# Patient Record
Sex: Female | Born: 1980 | Race: White | Hispanic: No | Marital: Married | Smoking: Former smoker
Health system: Southern US, Community
[De-identification: ages and names within clinical notes are randomized; demographics above are authoritative.]

## PROBLEM LIST (undated history)

## (undated) DIAGNOSIS — Z789 Other specified health status: Secondary | ICD-10-CM

## (undated) HISTORY — PX: WISDOM TOOTH EXTRACTION: SHX21

---
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
## 2010-06-09 NOTE — L&D Delivery Note (Signed)
Delivery Note At 10:04 AM a healthy female was delivered via Vaginal, Spontaneous Delivery (Presentation: Occ. Ant.  ).  Neonatal team present.  APGAR: 8, 9; weight 5 lb 6.2 oz (2444 g).   Placenta status: Intact, Pathology.  Cord: 3 vessels with the following complications: None.  Cord pH: Not done  Anesthesia: Local  Episiotomy: None Lacerations: 1st degree;Periurethral Suture Repair: vicryl Est. Blood Loss (mL): 400  Mom to postpartum.  Baby to nursery-stable.  Shatiqua Heroux,MARIE-LYNE 01/28/2011, 10:41 AM

## 2010-07-19 LAB — RPR: RPR: NONREACTIVE

## 2010-07-19 LAB — ABO/RH

## 2010-07-19 LAB — RUBELLA ANTIBODY, IGM: Rubella: IMMUNE

## 2010-08-08 ENCOUNTER — Inpatient Hospital Stay (HOSPITAL_COMMUNITY): Admission: AD | Admit: 2010-08-08 | Payer: Self-pay | Source: Ambulatory Visit | Admitting: Obstetrics

## 2011-01-27 ENCOUNTER — Encounter (HOSPITAL_COMMUNITY)
Admission: RE | Admit: 2011-01-27 | Discharge: 2011-01-27 | Disposition: A | Discharge status: Home / Self Care | Payer: PRIVATE HEALTH INSURANCE | Source: Ambulatory Visit | Attending: Obstetrics | Admitting: Obstetrics

## 2011-01-27 DIAGNOSIS — O923 Agalactia: Secondary | ICD-10-CM | POA: Insufficient documentation

## 2011-01-28 ENCOUNTER — Other Ambulatory Visit: Payer: Self-pay | Admitting: Obstetrics & Gynecology

## 2011-01-28 ENCOUNTER — Inpatient Hospital Stay (HOSPITAL_COMMUNITY)
Admission: AD | Admit: 2011-01-28 | Discharge: 2011-01-30 | DRG: 775 | Disposition: A | Discharge status: Home / Self Care | Payer: PRIVATE HEALTH INSURANCE | Source: Ambulatory Visit | Attending: Obstetrics & Gynecology | Admitting: Obstetrics & Gynecology

## 2011-01-28 ENCOUNTER — Encounter (HOSPITAL_COMMUNITY): Payer: Self-pay | Admitting: *Deleted

## 2011-01-28 DIAGNOSIS — O4290 Premature rupture of membranes, unspecified as to length of time between rupture and onset of labor, unspecified weeks of gestation: Secondary | ICD-10-CM | POA: Diagnosis present

## 2011-01-28 HISTORY — DX: Other specified health status: Z78.9

## 2011-01-28 LAB — CBC
MCV: 95.2 fL (ref 78.0–100.0)
Platelets: 256 10*3/uL (ref 150–400)
RDW: 12.9 % (ref 11.5–15.5)
WBC: 14.8 10*3/uL — ABNORMAL HIGH (ref 4.0–10.5)

## 2011-01-28 LAB — ABO/RH: ABO/RH(D): O POS

## 2011-01-28 MED ORDER — SIMETHICONE 80 MG PO CHEW: 80.0000 mg | CHEWABLE_TABLET | ORAL | Status: DC | PRN

## 2011-01-28 MED ORDER — NALBUPHINE SYRINGE 5 MG/0.5 ML: 5.0000 mg | INJECTION | Freq: Once | INTRAMUSCULAR | Status: DC

## 2011-01-28 MED ORDER — SENNOSIDES-DOCUSATE SODIUM 8.6-50 MG PO TABS
2.0000 | ORAL_TABLET | Freq: Every day | ORAL | Status: DC
  Administered 2011-01-28 – 2011-01-29 (×2): 2 via ORAL

## 2011-01-28 MED ORDER — DIPHENHYDRAMINE HCL 25 MG PO CAPS: 25.0000 mg | ORAL_CAPSULE | Freq: Four times a day (QID) | ORAL | Status: DC | PRN

## 2011-01-28 MED ORDER — ONDANSETRON HCL 4 MG/2ML IJ SOLN: 4.0000 mg | INTRAMUSCULAR | Status: DC | PRN

## 2011-01-28 MED ORDER — ACETAMINOPHEN 325 MG PO TABS: 650.0000 mg | ORAL_TABLET | ORAL | Status: DC | PRN

## 2011-01-28 MED ORDER — TETANUS-DIPHTH-ACELL PERTUSSIS 5-2.5-18.5 LF-MCG/0.5 IM SUSP
0.5000 mL | Freq: Once | INTRAMUSCULAR | Status: AC
  Administered 2011-01-29: 0.5 mL via INTRAMUSCULAR
  Filled 2011-01-28: qty 0.5

## 2011-01-28 MED ORDER — FLEET ENEMA 7-19 GM/118ML RE ENEM: 1.0000 | ENEMA | RECTAL | Status: DC | PRN

## 2011-01-28 MED ORDER — OXYCODONE-ACETAMINOPHEN 5-325 MG PO TABS: 2.0000 | ORAL_TABLET | ORAL | Status: DC | PRN

## 2011-01-28 MED ORDER — LACTATED RINGERS IV SOLN
INTRAVENOUS | Status: DC
  Administered 2011-01-28: 06:00:00 via INTRAVENOUS

## 2011-01-28 MED ORDER — DIBUCAINE 1 % RE OINT: 1.0000 "application " | TOPICAL_OINTMENT | RECTAL | Status: DC | PRN

## 2011-01-28 MED ORDER — OXYTOCIN BOLUS FROM INFUSION
500.0000 mL | Freq: Once | INTRAVENOUS | Status: DC
  Filled 2011-01-28: qty 500

## 2011-01-28 MED ORDER — LIDOCAINE HCL (PF) 1 % IJ SOLN
INTRAMUSCULAR | Status: AC
  Administered 2011-01-28: 30 mL via SUBCUTANEOUS
  Filled 2011-01-28: qty 30

## 2011-01-28 MED ORDER — PENICILLIN G POTASSIUM 5000000 UNITS IJ SOLR
2.5000 10*6.[IU] | INTRAVENOUS | Status: DC
  Filled 2011-01-28 (×5): qty 2.5

## 2011-01-28 MED ORDER — ZOLPIDEM TARTRATE 5 MG PO TABS: 5.0000 mg | ORAL_TABLET | Freq: Every evening | ORAL | Status: DC | PRN

## 2011-01-28 MED ORDER — LACTATED RINGERS IV SOLN: INTRAVENOUS | Status: DC

## 2011-01-28 MED ORDER — WITCH HAZEL-GLYCERIN EX PADS: 1.0000 "application " | MEDICATED_PAD | CUTANEOUS | Status: DC | PRN

## 2011-01-28 MED ORDER — ONDANSETRON HCL 4 MG/2ML IJ SOLN: 4.0000 mg | Freq: Four times a day (QID) | INTRAMUSCULAR | Status: DC | PRN

## 2011-01-28 MED ORDER — PENICILLIN G POTASSIUM 5000000 UNITS IJ SOLR
2.5000 10*6.[IU] | INTRAMUSCULAR | Status: DC
  Filled 2011-01-28 (×2): qty 5

## 2011-01-28 MED ORDER — IBUPROFEN 600 MG PO TABS
600.0000 mg | ORAL_TABLET | Freq: Four times a day (QID) | ORAL | Status: DC
  Administered 2011-01-28 – 2011-01-30 (×8): 600 mg via ORAL
  Filled 2011-01-28 (×6): qty 1

## 2011-01-28 MED ORDER — ONDANSETRON HCL 4 MG PO TABS: 4.0000 mg | ORAL_TABLET | ORAL | Status: DC | PRN

## 2011-01-28 MED ORDER — BENZOCAINE-MENTHOL 20-0.5 % EX AERO
INHALATION_SPRAY | CUTANEOUS | Status: AC
  Administered 2011-01-28: 1 via TOPICAL
  Filled 2011-01-28: qty 56

## 2011-01-28 MED ORDER — LIDOCAINE HCL (PF) 1 % IJ SOLN
30.0000 mL | INTRAMUSCULAR | Status: AC | PRN
  Administered 2011-01-28: 30 mL via SUBCUTANEOUS

## 2011-01-28 MED ORDER — IBUPROFEN 600 MG PO TABS
600.0000 mg | ORAL_TABLET | Freq: Four times a day (QID) | ORAL | Status: DC | PRN
  Filled 2011-01-28: qty 1

## 2011-01-28 MED ORDER — LACTATED RINGERS IV SOLN: 500.0000 mL | INTRAVENOUS | Status: DC | PRN

## 2011-01-28 MED ORDER — LANOLIN HYDROUS EX OINT: TOPICAL_OINTMENT | CUTANEOUS | Status: DC | PRN

## 2011-01-28 MED ORDER — BENZOCAINE-MENTHOL 20-0.5 % EX AERO
1.0000 "application " | INHALATION_SPRAY | CUTANEOUS | Status: DC | PRN
  Administered 2011-01-28: 1 via TOPICAL

## 2011-01-28 MED ORDER — OXYCODONE-ACETAMINOPHEN 5-325 MG PO TABS: 1.0000 | ORAL_TABLET | ORAL | Status: DC | PRN

## 2011-01-28 MED ORDER — DEXTROSE 5 % IV SOLN
5.0000 10*6.[IU] | Freq: Once | INTRAVENOUS | Status: AC
  Administered 2011-01-28: 5 10*6.[IU] via INTRAVENOUS
  Filled 2011-01-28: qty 5

## 2011-01-28 MED ORDER — TERBUTALINE SULFATE 1 MG/ML IJ SOLN: 0.2500 mg | Freq: Once | INTRAMUSCULAR | Status: DC | PRN

## 2011-01-28 MED ORDER — OXYTOCIN 10 UNIT/ML IJ SOLN
INTRAMUSCULAR | Status: AC
  Administered 2011-01-28: 10 [IU] via INTRAMUSCULAR
  Filled 2011-01-28: qty 2

## 2011-01-28 MED ORDER — OXYTOCIN 20 UNITS IN LACTATED RINGERS INFUSION - SIMPLE: 125.0000 mL/h | INTRAVENOUS | Status: DC

## 2011-01-28 MED ORDER — PRENATAL PLUS 27-1 MG PO TABS
1.0000 | ORAL_TABLET | Freq: Every day | ORAL | Status: DC
  Administered 2011-01-28 – 2011-01-30 (×3): 1 via ORAL
  Filled 2011-01-28 (×3): qty 1

## 2011-01-28 MED ORDER — CITRIC ACID-SODIUM CITRATE 334-500 MG/5ML PO SOLN: 30.0000 mL | ORAL | Status: DC | PRN

## 2011-01-28 MED ORDER — PENICILLIN G POTASSIUM 5000000 UNITS IJ SOLR
5.0000 10*6.[IU] | Freq: Once | INTRAMUSCULAR | Status: DC
  Filled 2011-01-28: qty 5

## 2011-01-28 MED ORDER — OXYTOCIN 20 UNITS IN LACTATED RINGERS INFUSION - SIMPLE
1.0000 m[IU]/min | INTRAVENOUS | Status: DC
  Administered 2011-01-28: 3 m[IU]/min via INTRAVENOUS
  Administered 2011-01-28: 2 m[IU]/min via INTRAVENOUS
  Administered 2011-01-28: 1 m[IU]/min via INTRAVENOUS
  Filled 2011-01-28: qty 1000

## 2011-01-28 NOTE — Progress Notes (Signed)
POST FERN SLIDE. PT CHANGING CLOTHES

## 2011-01-28 NOTE — Progress Notes (Signed)
WAS IN OFFICE 01-16-2011-  NO VE-

## 2011-01-28 NOTE — Progress Notes (Signed)
UR chart review completed.  

## 2011-01-28 NOTE — H&P (Signed)
Kelsey Flores is a 30 y.o. female G1P0000 [redacted]w[redacted]d presenting for PROM.  HPI/HPP:  Normal pregnancy up to now.  PROM clear fluid with irregular UCs.  No vag. Bleeding.  FMs pos.  No PIH Sx.  OB History    Grav Para Term Preterm Abortions TAB SAB Ect Mult Living   1 0 0 0 0 0 0 0 0 0      Past Medical History  Diagnosis Date  . No pertinent past medical history    Past Surgical History  Procedure Date  . Wisdom tooth extraction    Family History: family history is not on file. Social History:  reports that she has quit smoking. She has never used smokeless tobacco. She reports that she does not drink alcohol or use illicit drugs. Current facility-administered medications:oxytocin (PITOCIN) 10 UNIT/ML injection, , , , , 10 Units at 01/28/11 1010;  acetaminophen (TYLENOL) tablet 650 mg, 650 mg, Oral, Q4H PRN, Marie-[place]ne Narcisa Ganesh;  citric acid-sodium citrate (ORACIT) solution 30 mL, 30 mL, Oral, Q2H PRN, Marie-Lyne Kem Hensen;  ibuprofen (ADVIL,MOTRIN) tablet 600 mg, 600 mg, Oral, Q6H PRN, Marie-Lyne Ballard Budney lactated ringers infusion 500-1,000 mL, 500-1,000 mL, Intravenous, PRN, Marie-Lyne Letzy Gullickson;  lactated ringers infusion, , Intravenous, Continuous, Marie-[place]ne Annel Zunker;  lactated ringers infusion, , Intravenous, Continuous, [place]ne Adisyn Ruscitti;  lidocaine (XYLOCAINE) 1 % injection 30 mL, 30 mL, Subcutaneous, PRN, Marie-Lyne Kyrell Ruacho, 30 mL at 01/28/11 1013;  nalbuphine (NUBAIN) injection 5 mg, 5 mg, Subcutaneous, Once, Marie-[place] Vania Rosero nalbuphine (NUBAIN) injection 5 mg, 5 mg, Intravenous, Once, Marie-[place]ne Irene Collings;  ondansetron (ZOFRAN) injection 4 mg, 4 mg, Intravenous, Q6H PRN, Marie-Lyne Maddox Bratcher;  oxyCODONE-acetaminophen (PERCOCET) 5-325 MG per tablet 2 tablet, 2 tablet, Oral, Q3H PRN, Marie-Lyne Loni Delbridge;  oxytocin (PITOCIN) IV BOLUS FROM BAG, 500 mL, Intravenous, Once, Marie-Lyne Ruddy Swire oxytocin (PITOCIN) IV infusion 20 units in LR 1000 mL, 1-40 milli-units/min, Intravenous, Titrated, Marie-Lyne Jamorian Dimaria, 4  milli-units/min at 01/28/11 0743;  oxytocin (PITOCIN) IV infusion 20 units in LR 1000 mL, 125 mL/hr, Intravenous, Continuous, Marie-Lyne Kerigan Narvaez;  penicillin G potassium 2.5 Million Units in dextrose 5 % 100 mL IVPB, 2.5 Million Units, Intravenous, Q4H, Marie-Lyne Camey Edell penicillin G potassium 5 Million Units in dextrose 5 % 250 mL IVPB, 5 Million Units, Intravenous, Once, [employer], 5 Million Units at 01/28/11 0557;  sodium phosphate (FLEET) 7-19 GM/118ML enema 1 enema, 1 enema, Rectal, PRN, Marie-Lyne Aadvik Roker;  terbutaline (BRETHINE) injection 0.25 mg, 0.25 mg, Subcutaneous, Once PRN, Genia Del;  DISCONTD: nalbuphine (NUBAIN) injection 5 mg, 5 mg, Intravenous, Once, Marie-Lyne Aireonna Bauer DISCONTD: penicillin G potassium injection 2.5 Million Units, 2.5 Million Units, Intravenous, Q4H, Marie-Lyne Makyiah Lie;  DISCONTD: penicillin G potassium injection 5 Million Units, 5 Million Units, Intravenous, Once, Marie-Lyne Carlethia Mesquita No Known Allergies  Dilation: 10 Effacement (%): 100 Station: +1 Exam by:: m wilkins rnc Blood pressure 116/68, pulse 98, temperature 98.1 F (36.7 C), temperature source Oral, resp. rate 18, height 5\' 6"  (1.676 m), weight 78.019 kg (172 lb).  No pain meds.   FHR 140's, acc. Present.  Mild variables. Pen G covered.  HPP: There is no problem list on file for this patient.   Prenatal labs: ABO, Rh: O/Positive/-- (02/10 0000) Antibody: Negative (02/02 0000) Rubella:  Immune RPR: Nonreactive (02/10 0000)  HBsAg: Negative (02/10 0000)  HIV: Non-reactive (02/10 0000)  Genetic testing: Quad neg [place] anato: Normal 1 hr GTT: wnl GBS:  Not done yet.    Assessment/Plan: 35+ wks PROM.  Labor with Pito.  Pen G received.  FHR reassuring.  Ready for delivery.  Jermya Dowding,MARIE-LYNE 01/28/2011, 10:36 AM

## 2011-01-28 NOTE — Progress Notes (Signed)
DENIES H/A , BLURRED VISION - OR EPIGASTRIC PAIN 

## 2011-01-29 LAB — CBC
MCHC: 33.7 g/dL (ref 30.0–36.0)
Platelets: 247 10*3/uL (ref 150–400)
RDW: 13.4 % (ref 11.5–15.5)
WBC: 17.5 10*3/uL — ABNORMAL HIGH (ref 4.0–10.5)

## 2011-01-29 NOTE — Progress Notes (Signed)
Post Partum Day #1            Subjective:   Pt reports feeling well/ Tolerating po/ Voiding without problems/ No n/v/ Bleeding is light, reports one clot last night, none since/ Pain controlled withprescription NSAID's including ibuprofen (Motrin) Working on latch, infant on supplemental formula, 35 wks preemie  Newborn info:  Information for the patient's newborn:  ALETTE, KATAOKA Brittane [[id number]]  female  / circumcision - desires to delay circ until tomorrow, per peds to establish breastfeeding well     Objective:     VS: Blood pressure 128/83, pulse 66, temperature 97.9 F (36.6 C), temperature source Oral, resp. rate 18, height 5\' 6"  (1.676 m), weight 172 lb (78.019 kg)  Intake/Output Summary (Last 24 hours) at 01/29/11 1027 Last data filed at 01/28/11 1059  Gross per 24 hour  Intake      0 ml  Output    400 ml  Net   -400 ml        Basename 01/29/11 0550 01/28/11 0554  WBC 17.5* 14.8*  HGB 10.9* 11.8*  HCT 32.3* 33.9*  PLT 247 256     Blood type: --/--/O POS (08/21 0630)  Rubella: Immune (02/10 0000)     Physical Exam:   A & O x 3 NAD   Lungs: CTAB  Heart: RR  Abdomen: soft, non-tender, FF @ U-1  Perineum: repair intact, edema none  Lochia: small  Extremities: neg Homans', edema neg    Assessment/Plan:  PPD # 1 / 30 y.o., G1P0101 S/P:spontaneous vaginal    normal postpartum exam  Doing well  Continue routine post partum orders  Anticipate D/C in Am         LOS: 1 day   Jsaon Yoo, CNM, MSN 01/29/2011, 10:27 AM

## 2011-01-30 MED ORDER — IBUPROFEN 600 MG PO TABS: 600.0000 mg | ORAL_TABLET | Freq: Four times a day (QID) | ORAL | Status: AC

## 2011-01-30 NOTE — Progress Notes (Signed)
  PPD 2 SVD  S:  Reports feeling well             Tolerating po/ No nausea or vomiting             Bleeding is light             Pain controlled withprescription NSAID's including motrin only             Up ad lib / ambulatory  Newborn breast feeding  / Circumcision yes today prior to discharge   O:  A & O x 3 NAD             VS: Blood pressure 104/70, pulse 76, temperature 98.1 F (36.7 C), temperature source Oral, resp. rate 18, height 5\' 6"  (1.676 m), weight 78.019 kg (172 lb), unknown if currently breastfeeding.  LABS:  Basename 01/29/11 0550 01/28/11 0554  HGB 10.9* 11.8*  HCT 32.3* 33.9*    I&O:        Lungs: Clear and unlabored  Heart: regular rate and rhythm / no mumurs  Abdomen: soft, non-tender, non-distended              Fundus: firm, non-tender, U-2  Perineum: slight edema  Lochia: light  Extremities: no edema, no calf pain or tenderness, negative Homans    A/P: PPD # 2 Preterm SVD Female (35 weeks)   Doing well - stable status  Routine post partum orders  Discharge home  [facility] 01/30/2011, 10:15 AM

## 2011-01-30 NOTE — Discharge Summary (Signed)
Obstetric Discharge Summary Reason for Admission: onset of labor /preterm labor Prenatal Procedures: none Intrapartum Procedures: spontaneous vaginal delivery  Postpartum Procedures: none Complications-Operative and Postpartum: none Hemoglobin  Date Value Range Status  01/29/2011 10.9* 12.0-15.0 (g/dL) Final     HCT  Date Value Range Status  01/29/2011 32.3* 36.0-46.0 (%) Final    Discharge Diagnoses: Premature labor and SVD  Discharge Information: Date: 01/30/2011 Activity: pelvic rest Diet: routine Medications: Ibuprophen Condition: stable Instructions: refer to practice specific booklet Discharge to: home Follow-up Information    Follow up with [facility].. Make an appointment in 6 weeks.   Contact information:   [address] [place] [address] [phone]          Newborn Data: Live born female  Birth Weight: 5 lb 6.2 oz (2444 g) APGAR: 8, 9  Home with mother.  Kelsey Flores 01/30/2011, 10:16 AM

## 2011-01-31 ENCOUNTER — Ambulatory Visit (HOSPITAL_COMMUNITY)
Admission: RE | Admit: 2011-01-31 | Discharge: 2011-01-31 | Disposition: A | Discharge status: Home / Self Care | Payer: PRIVATE HEALTH INSURANCE | Source: Ambulatory Visit | Attending: Obstetrics | Admitting: Obstetrics

## 2011-01-31 NOTE — Progress Notes (Signed)
Infant Lactation Consultation Outpatient Visit Note  Patient Name: Kelsey Flores Date of Birth: April 05, 1981 Birth Weight:  5 lbs. 7 oz. Gestational Age at Delivery: Gestational Age: <None> [redacted] weeks gestation Type of Delivery: Vaginal Delivery @ 35 weeks  Breastfeeding History Frequency of Breastfeeding: 2-3 hrs.  Length of Feeding: 5-20 mins Voids: QS Stools: QS  Supplementing / Method: Pumping:  Type of Pump: manual   Frequency: none today, but usually every time baby feeds  Volume: 0-5ml    Comments: It was encouraged by inpatient LC to rent a DEBP, but patient declined as she was checking with insurance coverage and felt that a manual pump would be sufficient. Mom comes in today at day 3, breast full, firm, tender, and bilateral redness over both entire breasts.  Pre-pumped before helping with breastfeeding to soften, and make latching easier for Kelsey Flores.   Consultation Evaluation:  Initial Feeding Assessment: Pre-feed Weight:5-2.1 Post-feed Weight:5-2.4 Amount Transferred:10 ml Comments:right breast 25 mins.  Additional Feeding Assessment: Pre-feed Weight: 5-2.4 Post-feed Weight: 5-3.2 Amount Transferred: 25ml Comments:17ml from SNS (EBM) and 15 ml from breast  Additional Feeding Assessment: Pre-feed Weight: Post-feed Weight: Amount Transferred: Comments:  Total Breast milk Transferred this Visit: 25ml Total Supplement Given: 30ml (10 from SNS, 20 ml from bottle)  Additional Interventions:      Follow-Up Instructions given: Mom to feed Kelsey Flores every 2-3 hrs. To use SNS on 2nd breast with 30ml EBM If any milk left in SNS, offer bottle.  Mom to pump both breasts 15-20 mins.   Encouraged Mom to put ice packs on both breasts for 20 mins and pump 20 mins, repeating as needed to soften breasts.  Rented DEBP with instructions.  Made appt. For 02/05/11 @ 2:30p for f/u feeding assessment.      Kelsey Flores 01/31/2011, 6:27 PM

## 2011-02-05 ENCOUNTER — Encounter (HOSPITAL_COMMUNITY): Payer: Self-pay

## 2011-02-07 ENCOUNTER — Encounter (HOSPITAL_COMMUNITY): Payer: Self-pay | Admitting: Lactation Services

## 2011-02-07 ENCOUNTER — Ambulatory Visit (HOSPITAL_COMMUNITY)
Admission: RE | Admit: 2011-02-07 | Discharge: 2011-02-07 | Disposition: A | Discharge status: Home / Self Care | Payer: PRIVATE HEALTH INSURANCE | Source: Ambulatory Visit | Attending: Obstetrics | Admitting: Obstetrics

## 2011-02-07 NOTE — Progress Notes (Signed)
Infant Lactation Consultation Outpatient Visit Note  Patient Name: Kelsey Flores mother Infant Kelsey Flores Date of Birth: Dec 30, 1980 Birth Weight:  5 lbs. 6 oz. Gestational Age at Delivery: Gestational Age: <None>35 weeks Type of Delivery: SVD  Breastfeeding History Frequency of Breastfeeding: 2-3 hrs Length of Feeding: 10-20 mins Voids: 7 Stools: 7  Supplementing / Method: Pumping:  Type of Pump: symphony rented   Frequency: 3 hrs.  Volume:  60-55ml  Comments:    Consultation Evaluation:   Initial Feeding Assessment: Pre-feed Weight:5lbs 10.4 oz (a weight gain of 8 oz in 7 days) Post-feed Weight: Amount Transferred: 50ml Comments:  Infant had been fed 2 hours prior to outpatient appt.  Mom tried to latch and breast feed without nipple shield, but baby became non-nutritive after 10 mins, and tranferred minimal amt. (2 ml).  Initiated 24mm nipple shield and baby was vigorous with frequent, regular swallowing observed.  Baby transferred a total of 50 ml from both breasts.    Additional Feeding Assessment: Pre-feed Weight: Post-feed Weight: Amount Transferred: Comments:  Additional Feeding Assessment: Pre-feed Weight: Post-feed Weight: Amount Transferred: Comments:  Total Breast milk Transferred this Visit: 50 Total Supplement Given: 0  Additional Interventions:  Mom to continue to feed Kelsey Flores on demand with pumping pc 2-3 times a day.  To continue to pump 15-20 minutes whenever baby receives bottle feeding rather than breastfeeding.  Mom doing very well positioning and latching baby. 2 week pediatrician appointment in 4 days.   Follow-Up  Friday, Sept. 7th at 1pm for feeding assessment and weight    Kelsey Flores 02/07/2011, 12:09 PM

## 2011-02-07 NOTE — Progress Notes (Signed)
Infant Lactation Consultation Outpatient Visit Note  Patient Name: Kelsey Flores Date of Birth: 1980/12/03 Birth Weight: 5 lbs. 6 oz.  Gestational Age at Delivery: Gestational Age: <None> Type of Delivery: Vagnal  Breastfeeding History Frequency of Breastfeeding: 3 hrs Length of Feeding: 10-60m Voids: 7 Stools: 7  Supplementing / Method: Pumping:  Type of Pump: Rented Symphony   Frequency: every 3 hrs.  Volume:  40-80 ml.  Comments:    Consultation Evaluation:  Initial Feeding Assessment: Pre-feed [id number] Post-feed [id number]:1478 Amount Transferred:2 Comments: without using nipple shield, 10 minutes before falling asleep  Additional Feeding Assessment: Pre-feed [id number]:3086 Post-feed Weight:2600 Amount Transferred:36 Comments: With 24 mm nipple shield, baby much more nutritive   Additional Feeding Assessment: Pre-feed Weight: Post-feed Weight: Amount Transferred: Comments:  Total Breast milk Transferred this Visit:  Total Supplement Given:   Additional Interventions:   Follow-Up      Kelsey Flores 02/07/2011, 11:12 AM

## 2011-03-03 ENCOUNTER — Encounter (HOSPITAL_COMMUNITY)
Admission: RE | Admit: 2011-03-03 | Discharge: 2011-03-03 | Disposition: A | Discharge status: Home / Self Care | Payer: PRIVATE HEALTH INSURANCE | Source: Ambulatory Visit | Attending: Obstetrics | Admitting: Obstetrics

## 2011-03-03 DIAGNOSIS — O923 Agalactia: Secondary | ICD-10-CM | POA: Insufficient documentation

## 2014-02-10 ENCOUNTER — Ambulatory Visit (INDEPENDENT_AMBULATORY_CARE_PROVIDER_SITE_OTHER): Payer: Commercial Managed Care - PPO

## 2014-02-10 ENCOUNTER — Other Ambulatory Visit: Payer: Self-pay | Admitting: Sports Medicine

## 2014-02-10 ENCOUNTER — Ambulatory Visit (INDEPENDENT_AMBULATORY_CARE_PROVIDER_SITE_OTHER): Payer: Commercial Managed Care - PPO | Admitting: Sports Medicine

## 2014-02-10 ENCOUNTER — Encounter: Payer: Self-pay | Admitting: Sports Medicine

## 2014-02-10 VITALS — BP 140/82 | HR 80 | Ht 65.0 in | Wt 178.0 lb

## 2014-02-10 DIAGNOSIS — M25569 Pain in unspecified knee: Secondary | ICD-10-CM

## 2014-02-10 DIAGNOSIS — M25561 Pain in right knee: Secondary | ICD-10-CM

## 2014-02-10 MED ORDER — DICLOFENAC SODIUM 75 MG PO TBEC: 75.0000 mg | DELAYED_RELEASE_TABLET | Freq: Two times a day (BID) | ORAL | Status: DC

## 2014-02-10 NOTE — Progress Notes (Deleted)
Patient ID: Kelsey Flores, female   DOB: 08-15-1980, 33 y.o.   MRN: [id number]   Subjective:    I'm seeing this patient as a consultation for:  CC:   HPI:  Past medical history, Surgical history, Family history not pertinant except as noted below, Social history, Allergies, and medications have been entered into the medical record, reviewed, and no changes needed.   Review of Systems: No headache, visual changes, nausea, vomiting, diarrhea, constipation, dizziness, abdominal pain, skin rash, fevers, chills, night sweats, weight loss, swollen lymph nodes, body aches, joint swelling, muscle aches, chest pain, shortness of breath, mood changes, visual or auditory hallucinations.   Objective:   General: Well Developed, well nourished, and in no acute distress.  Neuro/Psych: Alert and oriented x3, extra-ocular muscles intact, able to move all 4 extremities, sensation grossly intact. Skin: Warm and dry, no rashes noted.  Respiratory: Not using accessory muscles, speaking in full sentences, trachea midline.  Cardiovascular: Pulses palpable, no extremity edema. Abdomen: Does not appear distended.   Impression and Recommendations:   This case required medical decision making of moderate complexity.
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
## 2014-02-10 NOTE — Assessment & Plan Note (Signed)
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 Diclofenac, formal PT, x-rays. Return in 4-6 weeks, injection if no better

## 2014-02-20 ENCOUNTER — Encounter: Payer: Self-pay | Admitting: Sports Medicine

## 2014-03-24 ENCOUNTER — Ambulatory Visit: Payer: Self-pay | Admitting: Sports Medicine

## 2014-04-10 ENCOUNTER — Encounter: Payer: Self-pay | Admitting: Sports Medicine

## 2014-09-05 IMAGING — CR DG KNEE 1-2V*L*
4 series · 4 of 4 positions shown · non-contrast
Comparison: None.

CLINICAL DATA: Right knee pain

EXAM:
LEFT KNEE - 1-2 VIEW

[view not recorded (1 of 4)]
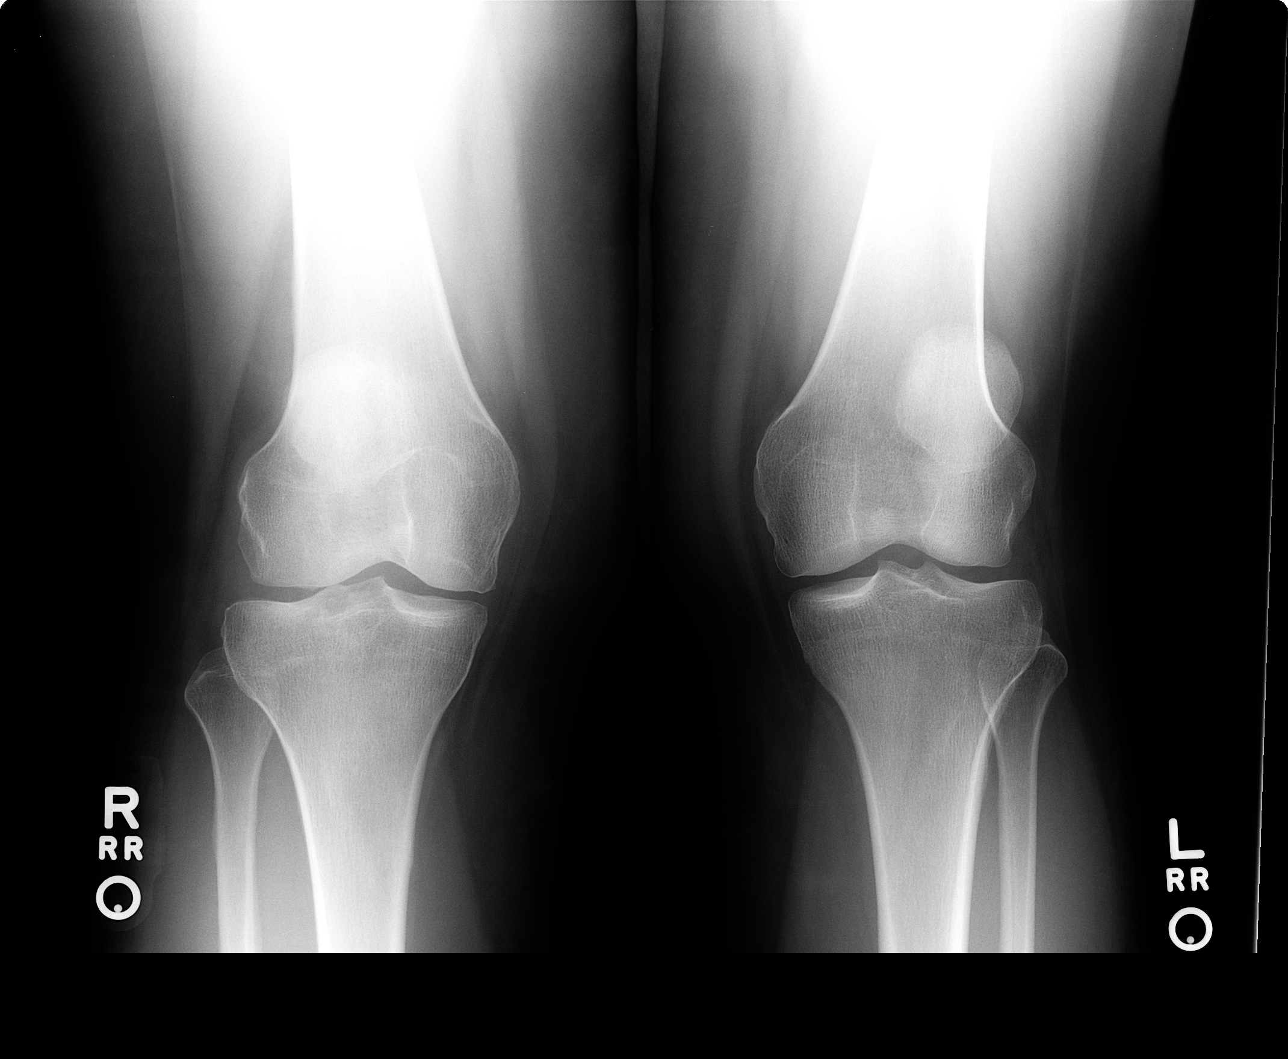

[view not recorded (2 of 4)]
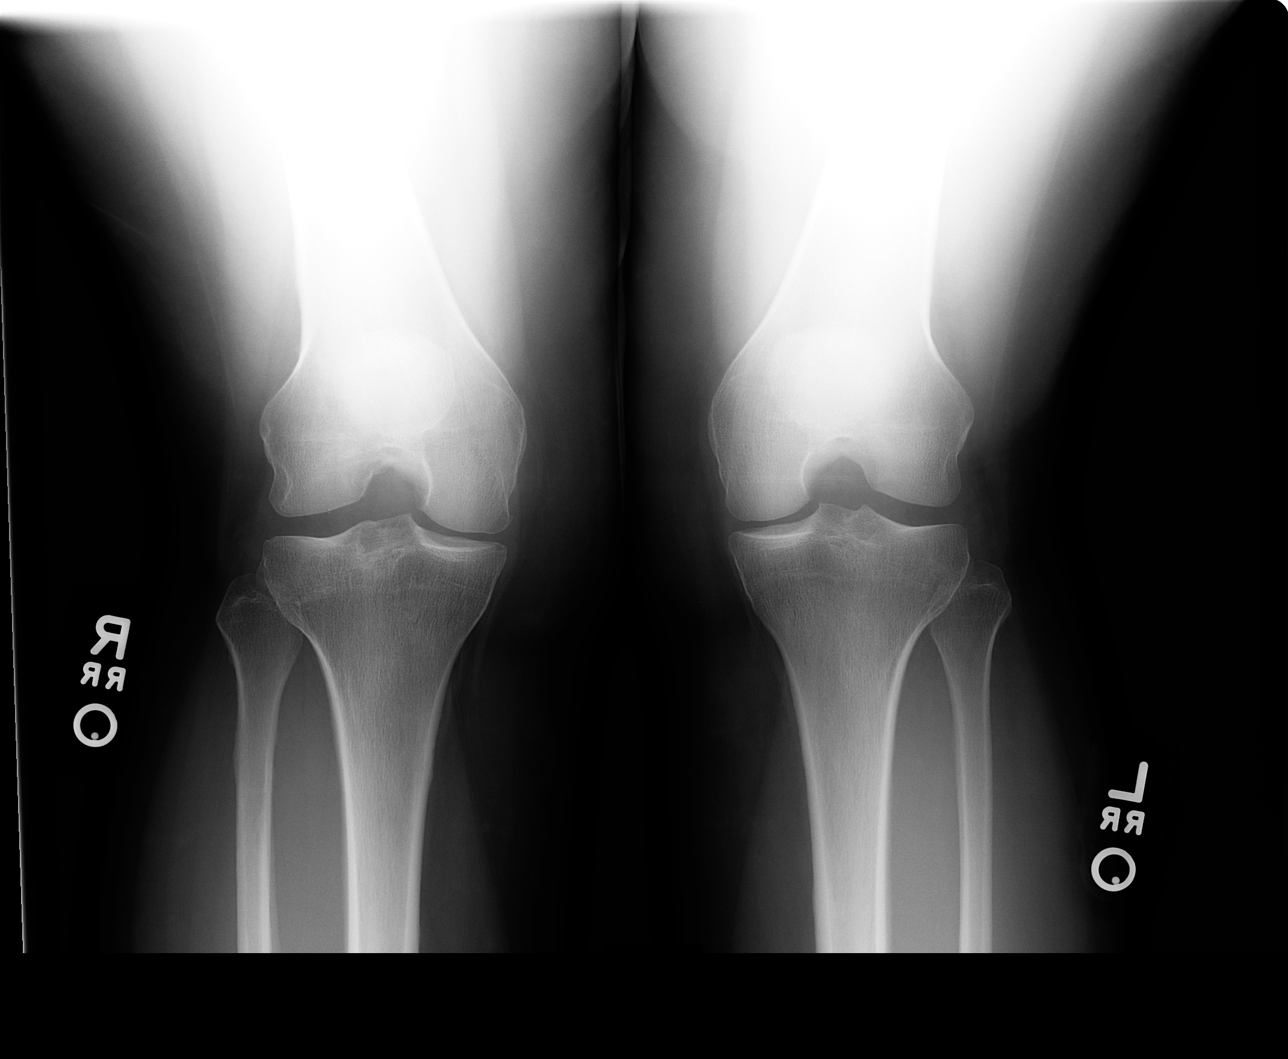

[view not recorded (3 of 4)]
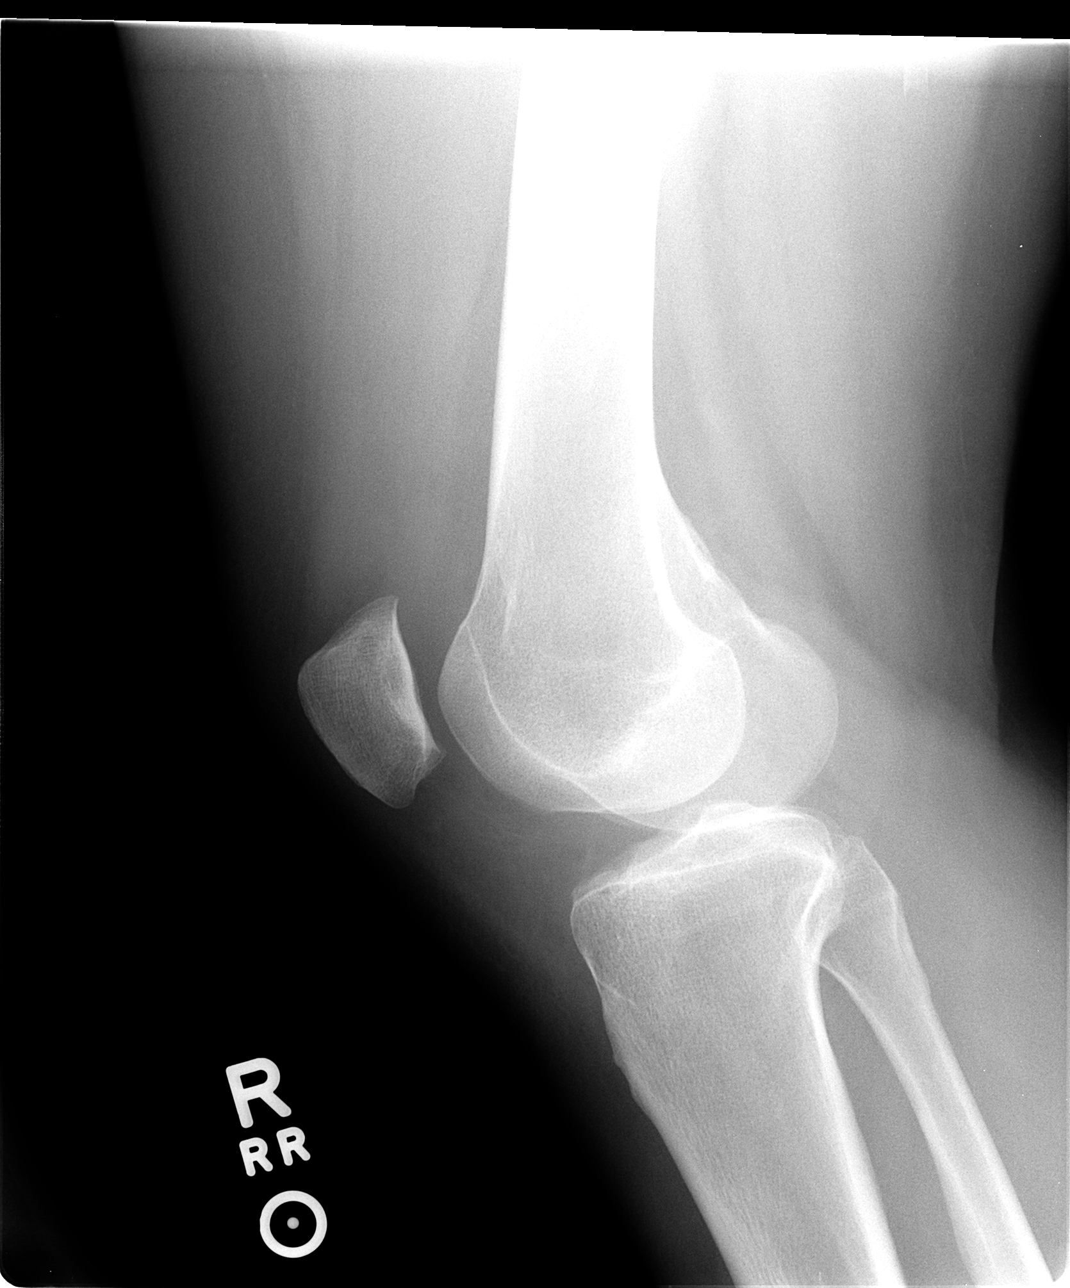

[view not recorded (4 of 4)]
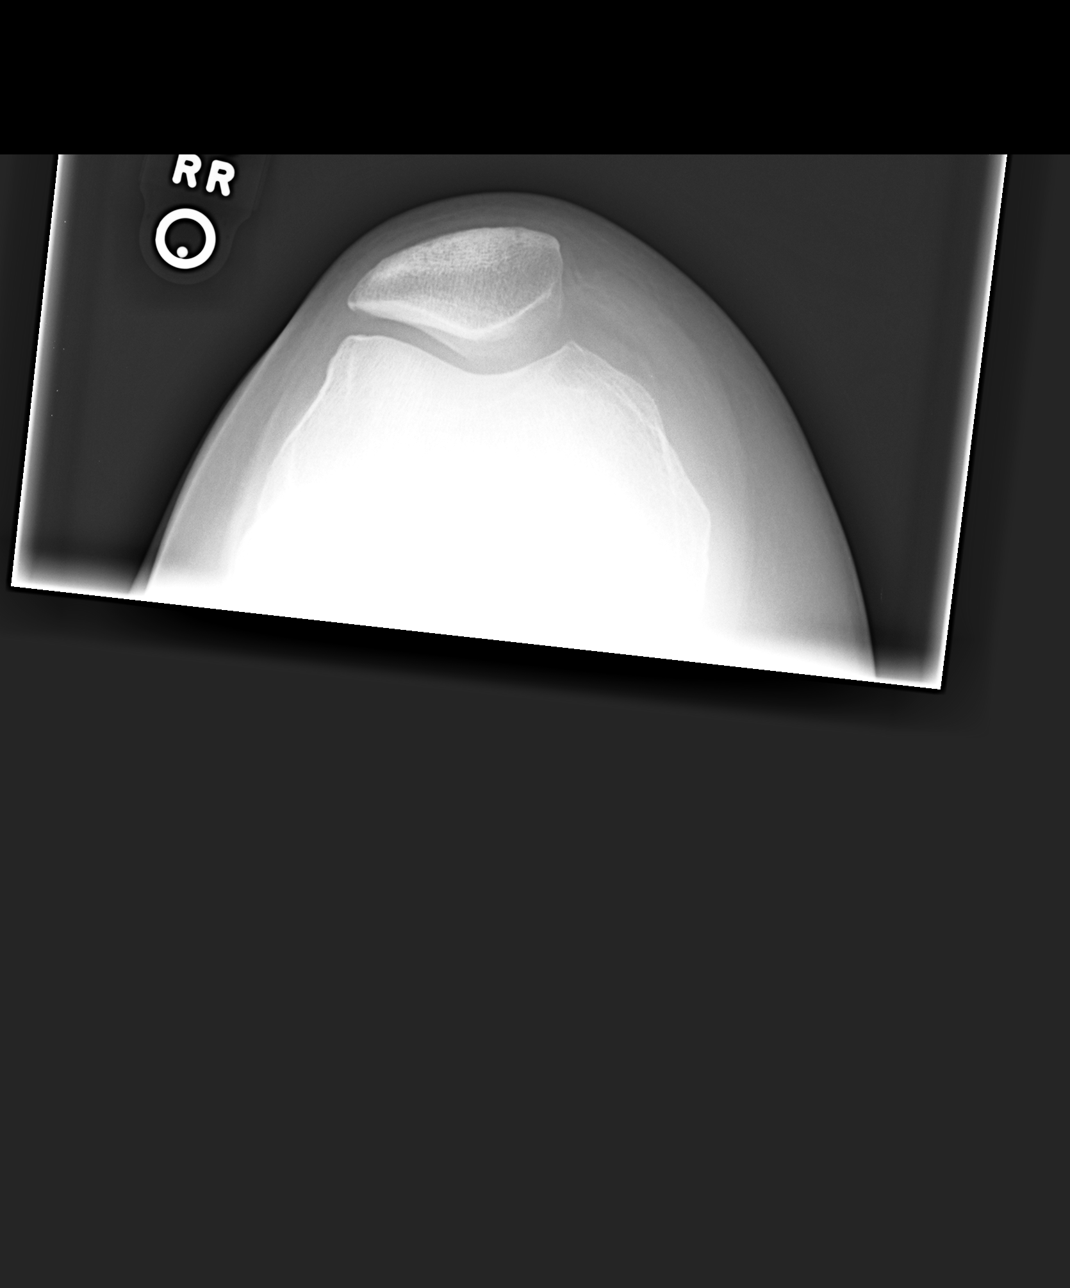

[4 of 4 positions shown; findings below may reference images not displayed]

FINDINGS: Standing views the knees were obtained. The the left knee joint
spaces are normal.
IMPRESSION: Negative.

## 2014-11-27 ENCOUNTER — Encounter: Payer: Self-pay | Admitting: Adult Health

## 2014-12-08 ENCOUNTER — Other Ambulatory Visit: Payer: Self-pay | Admitting: Obstetrics and Gynecology

## 2014-12-08 DIAGNOSIS — O3680X1 Pregnancy with inconclusive fetal viability, fetus 1: Secondary | ICD-10-CM

## 2014-12-12 ENCOUNTER — Ambulatory Visit (INDEPENDENT_AMBULATORY_CARE_PROVIDER_SITE_OTHER): Payer: Commercial Managed Care - PPO

## 2014-12-12 ENCOUNTER — Other Ambulatory Visit: Payer: Self-pay

## 2014-12-12 DIAGNOSIS — O3680X1 Pregnancy with inconclusive fetal viability, fetus 1: Secondary | ICD-10-CM | POA: Diagnosis not present

## 2014-12-12 NOTE — Progress Notes (Signed)
US 7+5wks single IUP w/ys,pos fht 175bpm,normal ov's bilat, corpus luteal cyst 2cm lt ov.,crl 1.69cm

## 2014-12-21 ENCOUNTER — Encounter: Payer: Self-pay | Admitting: Advanced Practice Midwife

## 2014-12-21 ENCOUNTER — Ambulatory Visit (INDEPENDENT_AMBULATORY_CARE_PROVIDER_SITE_OTHER): Payer: Commercial Managed Care - PPO | Admitting: Advanced Practice Midwife

## 2014-12-21 ENCOUNTER — Other Ambulatory Visit (HOSPITAL_COMMUNITY)
Admission: RE | Admit: 2014-12-21 | Discharge: 2014-12-21 | Disposition: A | Discharge status: Home / Self Care | Payer: Commercial Managed Care - PPO | Source: Ambulatory Visit | Attending: Advanced Practice Midwife | Admitting: Advanced Practice Midwife

## 2014-12-21 VITALS — BP 138/80 | HR 84 | Wt 173.5 lb

## 2014-12-21 DIAGNOSIS — Z0283 Encounter for blood-alcohol and blood-drug test: Secondary | ICD-10-CM

## 2014-12-21 DIAGNOSIS — Z1151 Encounter for screening for human papillomavirus (HPV): Secondary | ICD-10-CM | POA: Diagnosis present

## 2014-12-21 DIAGNOSIS — Z1389 Encounter for screening for other disorder: Secondary | ICD-10-CM

## 2014-12-21 DIAGNOSIS — Z113 Encounter for screening for infections with a predominantly sexual mode of transmission: Secondary | ICD-10-CM | POA: Diagnosis present

## 2014-12-21 DIAGNOSIS — Z3491 Encounter for supervision of normal pregnancy, unspecified, first trimester: Secondary | ICD-10-CM | POA: Diagnosis not present

## 2014-12-21 DIAGNOSIS — O09891 Supervision of other high risk pregnancies, first trimester: Secondary | ICD-10-CM

## 2014-12-21 DIAGNOSIS — O09211 Supervision of pregnancy with history of pre-term labor, first trimester: Secondary | ICD-10-CM

## 2014-12-21 DIAGNOSIS — Z331 Pregnant state, incidental: Secondary | ICD-10-CM

## 2014-12-21 DIAGNOSIS — Z369 Encounter for antenatal screening, unspecified: Secondary | ICD-10-CM

## 2014-12-21 DIAGNOSIS — Z01419 Encounter for gynecological examination (general) (routine) without abnormal findings: Secondary | ICD-10-CM | POA: Diagnosis not present

## 2014-12-21 DIAGNOSIS — Z3682 Encounter for antenatal screening for nuchal translucency: Secondary | ICD-10-CM

## 2014-12-21 LAB — POCT URINALYSIS DIPSTICK
GLUCOSE UA: NEGATIVE
NITRITE UA: NEGATIVE
Protein, UA: NEGATIVE
RBC UA: NEGATIVE

## 2014-12-21 NOTE — Progress Notes (Signed)
  Subjective:    Kelsey Flores is a [id number] 5254w0d being seen today for her first obstetrical visit.  Her obstetrical history is significant for PPROM at 35 weeks.  Pregnancy history fully reviewed. Discussed 17 p and booklet given.   Patient reports no complaints.  Filed Vitals:   12/21/14 1458  BP: 138/80  Pulse: 84  Weight: 173 lb 8 oz (78.699 kg)    HISTORY: OB History  Gravida Para Term Preterm AB SAB TAB Ectopic Multiple Living  2 1 0 1 0 0 0 0 0 1     # Outcome Date GA Lbr Len/2nd Weight Sex Delivery Anes PTL Lv  2 Current           1 Preterm 01/28/11 2062w4d 06:07 / 01:07 5 lb 6.2 oz (2.444 kg) M Vag-Spont Local  Y     Past Medical History  Diagnosis Date  . No pertinent past medical history    Past Surgical History  Procedure Laterality Date  . Wisdom tooth extraction     Family History  Problem Relation Age of Onset  . Other Father     blood clot on lung  . Stroke Maternal Grandmother   . Cancer Paternal Grandmother     breast  . Dementia Paternal Grandfather      Exam       Pelvic Exam:    Perineum: Normal Perineum   Vulva: normal   Vagina:  normal mucosa, normal discharge, no palpable nodules   Uterus Normal, Gravid, FH: 9     Cervix: normal   Adnexa: Not palpable   Urinary:  urethral meatus normal    System:     Skin: normal coloration and turgor, no rashes    Neurologic: oriented, normal, normal mood   Extremities: normal strength, tone, and muscle mass   HEENT PERRLA   Mouth/Teeth mucous membranes moist, normal dentition   Neck supple and no masses   Cardiovascular: regular rate and rhythm   Respiratory:  appears well, vitals normal, no respiratory distress, acyanotic   Abdomen: soft, non-tender;  FHR: 160us          Assessment:    Pregnancy: G2P0101 Patient Active Problem List   Diagnosis Date Noted  . History of preterm delivery, currently pregnant in first trimester 12/21/2014  . Right knee pain 02/10/2014        Plan:      Initial labs drawn. Continue prenatal vitamins  Problem list reviewed and updated  Reviewed n/v relief measures and warning s/s to report  Reviewed recommended weight gain based on pre-gravid BMI  Encouraged well-balanced diet Genetic Screening discussed Integrated Screen: declined.  Ultrasound discussed; fetal survey: requested.  Follow up in 4 weeks for LROB.  Will let us know if she wants to do 17p.  CRESENZO-DISHMAN,Antonella Upson 12/27/2014

## 2014-12-21 NOTE — Patient Instructions (Addendum)
Safe Medications in Pregnancy   Acne: Benzoyl Peroxide Salicylic Acid  Backache/Headache: Tylenol: 2 regular strength every 4 hours OR              2 Extra strength every 6 hours  Colds/Coughs/Allergies: Benadryl (alcohol free) 25 mg every 6 hours as needed Breath right strips Claritin Cepacol throat lozenges Chloraseptic throat spray Cold-Eeze- up to three times per day Cough drops, alcohol free Flonase (by prescription only) Guaifenesin Mucinex Robitussin DM (plain only, alcohol free) Saline nasal spray/drops Sudafed (pseudoephedrine) & Actifed ** use only after [redacted] weeks gestation and if you do not have high blood pressure Tylenol Vicks Vaporub Zinc lozenges Zyrtec   Constipation: Colace Ducolax suppositories Fleet enema Glycerin suppositories Metamucil Milk of magnesia Miralax Senokot Smooth move tea  Diarrhea: Kaopectate Imodium A-D  *NO pepto Bismol  Hemorrhoids: Anusol Anusol HC Preparation H Tucks  Indigestion: Tums Maalox Mylanta Zantac  Pepcid  Insomnia: Benadryl (alcohol free) 25mg every 6 hours as needed Tylenol PM Unisom, no Gelcaps  Leg Cramps: Tums MagGel  Nausea/Vomiting:  Bonine Dramamine Emetrol Ginger extract Sea bands Meclizine  Nausea medication to take during pregnancy:  Unisom (doxylamine succinate 25 mg tablets) Take one tablet daily at bedtime. If symptoms are not adequately controlled, the dose can be increased to a maximum recommended dose of two tablets daily (1/2 tablet in the morning, 1/2 tablet mid-afternoon and one at bedtime). Vitamin B6 100mg tablets. Take one tablet twice a day (up to 200 mg per day).  Skin Rashes: Aveeno products Benadryl cream or 25mg every 6 hours as needed Calamine Lotion 1% cortisone cream  Yeast infection: Gyne-lotrimin 7 Monistat 7   **If taking multiple medications, please check labels to avoid duplicating the same active ingredients **take medication as directed on  the label ** Do not exceed 4000 mg of tylenol in 24 hours **Do not take medications that contain aspirin or ibuprofen    First Trimester of Pregnancy The first trimester of pregnancy is from week 1 until the end of week 12 (months 1 through 3). A week after a sperm fertilizes an egg, the egg will implant on the wall of the uterus. This embryo will begin to develop into a baby. Genes from you and your partner are forming the baby. The female genes determine whether the baby is a boy or a girl. At 6-8 weeks, the eyes and face are formed, and the heartbeat can be seen on ultrasound. At the end of 12 weeks, all the baby's organs are formed.  Now that you are pregnant, you will want to do everything you can to have a healthy baby. Two of the most important things are to get good prenatal care and to follow your health care provider's instructions. Prenatal care is all the medical care you receive before the baby's birth. This care will help prevent, find, and treat any problems during the pregnancy and childbirth. BODY CHANGES Your body goes through many changes during pregnancy. The changes vary from woman to woman.   You may gain or lose a couple of pounds at first.  You may feel sick to your stomach (nauseous) and throw up (vomit). If the vomiting is uncontrollable, call your health care provider.  You may tire easily.  You may develop headaches that can be relieved by medicines approved by your health care provider.  You may urinate more often. Painful urination may mean you have a bladder infection.  You may develop heartburn as a result of your   pregnancy.  You may develop constipation because certain hormones are causing the muscles that push waste through your intestines to slow down.  You may develop hemorrhoids or swollen, bulging veins (varicose veins).  Your breasts may begin to grow larger and become tender. Your nipples may stick out more, and the tissue that surrounds them (areola)  may become darker.  Your gums may bleed and may be sensitive to brushing and flossing.  Dark spots or blotches (chloasma, mask of pregnancy) may develop on your face. This will likely fade after the baby is born.  Your menstrual periods will stop.  You may have a loss of appetite.  You may develop cravings for certain kinds of food.  You may have changes in your emotions from day to day, such as being excited to be pregnant or being concerned that something may go wrong with the pregnancy and baby.  You may have more vivid and strange dreams.  You may have changes in your hair. These can include thickening of your hair, rapid growth, and changes in texture. Some women also have hair loss during or after pregnancy, or hair that feels dry or thin. Your hair will most likely return to normal after your baby is born. WHAT TO EXPECT AT YOUR PRENATAL VISITS During a routine prenatal visit:  You will be weighed to make sure you and the baby are growing normally.  Your blood pressure will be taken.  Your abdomen will be measured to track your baby's growth.  The fetal heartbeat will be listened to starting around week 10 or 12 of your pregnancy.  Test results from any previous visits will be discussed. Your health care provider may ask you:  How you are feeling.  If you are feeling the baby move.  If you have had any abnormal symptoms, such as leaking fluid, bleeding, severe headaches, or abdominal cramping.  If you have any questions. Other tests that may be performed during your first trimester include:  Blood tests to find your blood type and to check for the presence of any previous infections. They will also be used to check for low iron levels (anemia) and Rh antibodies. Later in the pregnancy, blood tests for diabetes will be done along with other tests if problems develop.  Urine tests to check for infections, diabetes, or protein in the urine.  An ultrasound to confirm  the proper growth and development of the baby.  An amniocentesis to check for possible genetic problems.  Fetal screens for spina bifida and Down syndrome.  You may need other tests to make sure you and the baby are doing well. HOME CARE INSTRUCTIONS  Medicines  Follow your health care provider's instructions regarding medicine use. Specific medicines may be either safe or unsafe to take during pregnancy.  Take your prenatal vitamins as directed.  If you develop constipation, try taking a stool softener if your health care provider approves. Diet  Eat regular, well-balanced meals. Choose a variety of foods, such as meat or vegetable-based protein, fish, milk and low-fat dairy products, vegetables, fruits, and whole grain breads and cereals. Your health care provider will help you determine the amount of weight gain that is right for you.  Avoid raw meat and uncooked cheese. These carry germs that can cause birth defects in the baby.  Eating four or five small meals rather than three large meals a day may help relieve nausea and vomiting. If you start to feel nauseous, eating a few soda crackers  can be helpful. Drinking liquids between meals instead of during meals also seems to help nausea and vomiting.  If you develop constipation, eat more high-fiber foods, such as fresh vegetables or fruit and whole grains. Drink enough fluids to keep your urine clear or pale yellow. Activity and Exercise  Exercise only as directed by your health care provider. Exercising will help you:  Control your weight.  Stay in shape.  Be prepared for labor and delivery.  Experiencing pain or cramping in the lower abdomen or low back is a good sign that you should stop exercising. Check with your health care provider before continuing normal exercises.  Try to avoid standing for long periods of time. Move your legs often if you must stand in one place for a long time.  Avoid heavy lifting.  Wear  low-heeled shoes, and practice good posture.  You may continue to have sex unless your health care provider directs you otherwise. Relief of Pain or Discomfort  Wear a good support bra for breast tenderness.   Take warm sitz baths to soothe any pain or discomfort caused by hemorrhoids. Use hemorrhoid cream if your health care provider approves.   Rest with your legs elevated if you have leg cramps or low back pain.  If you develop varicose veins in your legs, wear support hose. Elevate your feet for 15 minutes, 3-4 times a day. Limit salt in your diet. Prenatal Care  Schedule your prenatal visits by the twelfth week of pregnancy. They are usually scheduled monthly at first, then more often in the last 2 months before delivery.  Write down your questions. Take them to your prenatal visits.  Keep all your prenatal visits as directed by your health care provider. Safety  Wear your seat belt at all times when driving.  Make a list of emergency phone numbers, including numbers for family, friends, the hospital, and police and fire departments. General Tips  Ask your health care provider for a referral to a local prenatal education class. Begin classes no later than at the beginning of month 6 of your pregnancy.  Ask for help if you have counseling or nutritional needs during pregnancy. Your health care provider can offer advice or refer you to specialists for help with various needs.  Do not use hot tubs, steam rooms, or saunas.  Do not douche or use tampons or scented sanitary pads.  Do not cross your legs for long periods of time.  Avoid cat litter boxes and soil used by cats. These carry germs that can cause birth defects in the baby and possibly loss of the fetus by miscarriage or stillbirth.  Avoid all smoking, herbs, alcohol, and medicines not prescribed by your health care provider. Chemicals in these affect the formation and growth of the baby.  Schedule a dentist  appointment. At home, brush your teeth with a soft toothbrush and be gentle when you floss. SEEK MEDICAL CARE IF:   You have dizziness.  You have mild pelvic cramps, pelvic pressure, or nagging pain in the abdominal area.  You have persistent nausea, vomiting, or diarrhea.  You have a bad smelling vaginal discharge.  You have pain with urination.  You notice increased swelling in your face, hands, legs, or ankles. SEEK IMMEDIATE MEDICAL CARE IF:   You have a fever.  You are leaking fluid from your vagina.  You have spotting or bleeding from your vagina.  You have severe abdominal cramping or pain.  You have rapid weight gain  or loss.  You vomit blood or material that looks like coffee grounds.  You are exposed to [place] measles and have never had them.  You are exposed to fifth disease or chickenpox.  You develop a severe headache.  You have shortness of breath.  You have any kind of trauma, such as from a fall or a car accident. Document Released: 05/20/2001 Document Revised: 10/10/2013 Document Reviewed: 04/05/2013 [facility] Patient Information 2015 [employer], [place]. This information is not intended to replace advice given to you by your health care provider. Make sure you discuss any questions you have with your health care provider.   Discuss 17p and genetic screening and cystic fibrosis screening

## 2014-12-23 LAB — URINE CULTURE

## 2014-12-26 LAB — CYTOLOGY - PAP

## 2014-12-27 ENCOUNTER — Encounter: Payer: Self-pay | Admitting: Advanced Practice Midwife

## 2014-12-29 LAB — PMP SCREEN PROFILE (10S), URINE
Amphetamine Screen, Ur: NEGATIVE ng/mL
Barbiturate Screen, Ur: NEGATIVE ng/mL
Benzodiazepine Screen, Urine: NEGATIVE ng/mL
COCAINE(METAB.) SCREEN, URINE: NEGATIVE ng/mL
Cannabinoids Ur Ql Scn: NEGATIVE ng/mL
Creatinine(Crt), U: 55.6 mg/dL (ref 20.0–300.0)
Methadone Scn, Ur: NEGATIVE ng/mL
OPIATE SCRN UR: NEGATIVE ng/mL
Oxycodone+Oxymorphone Ur Ql Scn: NEGATIVE ng/mL
PCP Scrn, Ur: NEGATIVE ng/mL
PH UR, DRUG SCRN: 5.9 (ref 4.5–8.9)
Propoxyphene, Screen: NEGATIVE ng/mL

## 2014-12-29 LAB — CBC
Hematocrit: 35.4 % (ref 34.0–46.6)
Hemoglobin: 11.8 g/dL (ref 11.1–15.9)
MCH: 31.2 pg (ref 26.6–33.0)
MCHC: 33.3 g/dL (ref 31.5–35.7)
MCV: 94 fL (ref 79–97)
Platelets: 294 10*3/uL (ref 150–379)
RBC: 3.78 x10E6/uL (ref 3.77–5.28)
RDW: 13.2 % (ref 12.3–15.4)
WBC: 11.4 10*3/uL — ABNORMAL HIGH (ref 3.4–10.8)

## 2014-12-29 LAB — HEPATITIS B SURFACE ANTIGEN: Hepatitis B Surface Ag: NEGATIVE

## 2014-12-29 LAB — ABO/RH: RH TYPE: POSITIVE

## 2014-12-29 LAB — URINALYSIS, ROUTINE W REFLEX MICROSCOPIC
Bilirubin, UA: NEGATIVE
GLUCOSE, UA: NEGATIVE
Ketones, UA: NEGATIVE
NITRITE UA: NEGATIVE
PROTEIN UA: NEGATIVE
RBC, UA: NEGATIVE
Specific Gravity, UA: 1.008 (ref 1.005–1.030)
Urobilinogen, Ur: 0.2 mg/dL (ref 0.2–1.0)
pH, UA: 6.5 (ref 5.0–7.5)

## 2014-12-29 LAB — RPR: RPR: NONREACTIVE

## 2014-12-29 LAB — VARICELLA ZOSTER ANTIBODY, IGG: Varicella zoster IgG: 1423 index (ref >165)

## 2014-12-29 LAB — CYSTIC FIBROSIS MUTATION 97: Interpretation: NOT DETECTED

## 2014-12-29 LAB — MICROSCOPIC EXAMINATION: Casts: NONE SEEN /lpf

## 2014-12-29 LAB — RUBELLA SCREEN: RUBELLA: 7.45 {index} (ref >0.99)

## 2014-12-29 LAB — HIV ANTIBODY (ROUTINE TESTING W REFLEX): HIV SCREEN 4TH GENERATION: NONREACTIVE

## 2014-12-29 LAB — ANTIBODY SCREEN: Antibody Screen: NEGATIVE

## 2015-01-03 ENCOUNTER — Encounter: Payer: Self-pay | Admitting: Advanced Practice Midwife

## 2015-01-03 DIAGNOSIS — Z3490 Encounter for supervision of normal pregnancy, unspecified, unspecified trimester: Secondary | ICD-10-CM | POA: Insufficient documentation

## 2015-01-18 ENCOUNTER — Encounter: Payer: Self-pay | Admitting: Obstetrics & Gynecology

## 2015-01-18 ENCOUNTER — Ambulatory Visit (INDEPENDENT_AMBULATORY_CARE_PROVIDER_SITE_OTHER): Payer: Commercial Managed Care - PPO | Admitting: Obstetrics & Gynecology

## 2015-01-18 VITALS — BP 120/70 | HR 72 | Wt 169.0 lb

## 2015-01-18 DIAGNOSIS — Z331 Pregnant state, incidental: Secondary | ICD-10-CM

## 2015-01-18 DIAGNOSIS — Z1389 Encounter for screening for other disorder: Secondary | ICD-10-CM

## 2015-01-18 DIAGNOSIS — Z3492 Encounter for supervision of normal pregnancy, unspecified, second trimester: Secondary | ICD-10-CM

## 2015-01-18 LAB — POCT URINALYSIS DIPSTICK
Glucose, UA: NEGATIVE
Ketones, UA: NEGATIVE
LEUKOCYTES UA: NEGATIVE
NITRITE UA: NEGATIVE
PROTEIN UA: NEGATIVE
RBC UA: NEGATIVE

## 2015-01-18 NOTE — Progress Notes (Signed)
[id number] [redacted]w[redacted]d Estimated Date of Delivery: 07/26/15  Blood pressure 120/70, pulse 72, weight 169 lb (76.658 kg), last menstrual period 10/19/2014, unknown if currently breastfeeding.   BP weight and urine results all reviewed and noted.  Please refer to the obstetrical flow sheet for the fundal height and fetal heart rate documentation:  Patient reports good fetal movement, denies any bleeding and no rupture of membranes symptoms or regular contractions. Patient is without complaints. All questions were answered.  Plan:  Continued routine obstetrical care,   Follow up in 4 weeks for OB appointment,

## 2015-02-15 ENCOUNTER — Encounter: Payer: Self-pay | Admitting: Advanced Practice Midwife

## 2015-02-15 ENCOUNTER — Ambulatory Visit (INDEPENDENT_AMBULATORY_CARE_PROVIDER_SITE_OTHER): Payer: Commercial Managed Care - PPO | Admitting: Advanced Practice Midwife

## 2015-02-15 VITALS — BP 116/70 | HR 76 | Wt 173.0 lb

## 2015-02-15 DIAGNOSIS — Z363 Encounter for antenatal screening for malformations: Secondary | ICD-10-CM

## 2015-02-15 DIAGNOSIS — Z331 Pregnant state, incidental: Secondary | ICD-10-CM

## 2015-02-15 DIAGNOSIS — Z3492 Encounter for supervision of normal pregnancy, unspecified, second trimester: Secondary | ICD-10-CM

## 2015-02-15 DIAGNOSIS — Z1389 Encounter for screening for other disorder: Secondary | ICD-10-CM

## 2015-02-15 LAB — POCT URINALYSIS DIPSTICK
Glucose, UA: NEGATIVE
KETONES UA: NEGATIVE
Leukocytes, UA: NEGATIVE
Nitrite, UA: NEGATIVE
Protein, UA: NEGATIVE
RBC UA: NEGATIVE

## 2015-02-15 NOTE — Patient Instructions (Signed)
Second Trimester of Pregnancy The second trimester is from week 13 through week 28, months 4 through 6. The second trimester is often a time when you feel your best. Your body has also adjusted to being pregnant, and you begin to feel better physically. Usually, morning sickness has lessened or quit completely, you may have more energy, and you may have an increase in appetite. The second trimester is also a time when the fetus is growing rapidly. At the end of the sixth month, the fetus is about 9 inches long and weighs about 1 pounds. You will likely begin to feel the baby move (quickening) between 18 and 20 weeks of the pregnancy. BODY CHANGES Your body goes through many changes during pregnancy. The changes vary from woman to woman.   Your weight will continue to increase. You will notice your lower abdomen bulging out.  You may begin to get stretch marks on your hips, abdomen, and breasts.  You may develop headaches that can be relieved by medicines approved by your health care provider.  You may urinate more often because the fetus is pressing on your bladder.  You may develop or continue to have heartburn as a result of your pregnancy.  You may develop constipation because certain hormones are causing the muscles that push waste through your intestines to slow down.  You may develop hemorrhoids or swollen, bulging veins (varicose veins).  You may have back pain because of the weight gain and pregnancy hormones relaxing your joints between the bones in your pelvis and as a result of a shift in weight and the muscles that support your balance.  Your breasts will continue to grow and be tender.  Your gums may bleed and may be sensitive to brushing and flossing.  Dark spots or blotches (chloasma, mask of pregnancy) may develop on your face. This will likely fade after the baby is born.  A dark line from your belly button to the pubic area (linea nigra) may appear. This will likely fade  after the baby is born.  You may have changes in your hair. These can include thickening of your hair, rapid growth, and changes in texture. Some women also have hair loss during or after pregnancy, or hair that feels dry or thin. Your hair will most likely return to normal after your baby is born. WHAT TO EXPECT AT YOUR PRENATAL VISITS During a routine prenatal visit:  You will be weighed to make sure you and the fetus are growing normally.  Your blood pressure will be taken.  Your abdomen will be measured to track your baby's growth.  The fetal heartbeat will be listened to.  Any test results from the previous visit will be discussed. Your health care provider may ask you:  How you are feeling.  If you are feeling the baby move.  If you have had any abnormal symptoms, such as leaking fluid, bleeding, severe headaches, or abdominal cramping.  If you have any questions. Other tests that may be performed during your second trimester include:  Blood tests that check for:  Low iron levels (anemia).  Gestational diabetes (between 24 and 28 weeks).  Rh antibodies.  Urine tests to check for infections, diabetes, or protein in the urine.  An ultrasound to confirm the proper growth and development of the baby.  An amniocentesis to check for possible genetic problems.  Fetal screens for spina bifida and Down syndrome. HOME CARE INSTRUCTIONS   Avoid all smoking, herbs, alcohol, and unprescribed   drugs. These chemicals affect the formation and growth of the baby.  Follow your health care provider's instructions regarding medicine use. There are medicines that are either safe or unsafe to take during pregnancy.  Exercise only as directed by your health care provider. Experiencing uterine cramps is a good sign to stop exercising.  Continue to eat regular, healthy meals.  Wear a good support bra for breast tenderness.  Do not use hot tubs, steam rooms, or saunas.  Wear your  seat belt at all times when driving.  Avoid raw meat, uncooked cheese, cat litter boxes, and soil used by cats. These carry germs that can cause birth defects in the baby.  Take your prenatal vitamins.  Try taking a stool softener (if your health care provider approves) if you develop constipation. Eat more high-fiber foods, such as fresh vegetables or fruit and whole grains. Drink plenty of fluids to keep your urine clear or pale yellow.  Take warm sitz baths to soothe any pain or discomfort caused by hemorrhoids. Use hemorrhoid cream if your health care provider approves.  If you develop varicose veins, wear support hose. Elevate your feet for 15 minutes, 3-4 times a day. Limit salt in your diet.  Avoid heavy lifting, wear low heel shoes, and practice good posture.  Rest with your legs elevated if you have leg cramps or low back pain.  Visit your dentist if you have not gone yet during your pregnancy. Use a soft toothbrush to brush your teeth and be gentle when you floss.  A sexual relationship may be continued unless your health care provider directs you otherwise.  Continue to go to all your prenatal visits as directed by your health care provider. SEEK MEDICAL CARE IF:   You have dizziness.  You have mild pelvic cramps, pelvic pressure, or nagging pain in the abdominal area.  You have persistent nausea, vomiting, or diarrhea.  You have a bad smelling vaginal discharge.  You have pain with urination. SEEK IMMEDIATE MEDICAL CARE IF:   You have a fever.  You are leaking fluid from your vagina.  You have spotting or bleeding from your vagina.  You have severe abdominal cramping or pain.  You have rapid weight gain or loss.  You have shortness of breath with chest pain.  You notice sudden or extreme swelling of your face, hands, ankles, feet, or legs.  You have not felt your baby move in over an hour.  You have severe headaches that do not go away with  medicine.  You have vision changes. Document Released: 05/20/2001 Document Revised: 05/31/2013 Document Reviewed: 07/27/2012 [employer] Patient Information 2015 [employer]. This information is not intended to replace advice given to you by your health care provider. Make sure you discuss any questions you have with your health care provider.  

## 2015-02-15 NOTE — Progress Notes (Signed)
[id number] [redacted]w[redacted]d Estimated Date of Delivery: 07/26/15  Blood pressure 116/70, pulse 76, weight 173 lb (78.472 kg), last menstrual period 10/19/2014, unknown if currently breastfeeding.   BP weight and urine results all reviewed and noted.  Please refer to the obstetrical flow sheet for the fundal height and fetal heart rate documentation:  Patient denies any bleeding and no rupture of membranes symptoms or regular contractions. Patient is without complaints. All questions were answered.  Orders Placed This Encounter  Procedures  . US OB Comp + 14 Wk  . POCT urinalysis dipstick    Plan:  Continued routine obstetrical care,   Return in about 2 weeks (around 03/01/2015) for [id number], LROB.

## 2015-02-15 NOTE — Progress Notes (Signed)
Pt denies any problems or concerns at this time.  

## 2015-02-28 ENCOUNTER — Encounter: Payer: Self-pay | Admitting: Advanced Practice Midwife

## 2015-02-28 ENCOUNTER — Ambulatory Visit (INDEPENDENT_AMBULATORY_CARE_PROVIDER_SITE_OTHER): Payer: Commercial Managed Care - PPO | Admitting: Advanced Practice Midwife

## 2015-02-28 ENCOUNTER — Ambulatory Visit (INDEPENDENT_AMBULATORY_CARE_PROVIDER_SITE_OTHER): Payer: Commercial Managed Care - PPO

## 2015-02-28 VITALS — BP 118/72 | HR 68 | Wt 172.4 lb

## 2015-02-28 DIAGNOSIS — Z331 Pregnant state, incidental: Secondary | ICD-10-CM

## 2015-02-28 DIAGNOSIS — O09891 Supervision of other high risk pregnancies, first trimester: Secondary | ICD-10-CM

## 2015-02-28 DIAGNOSIS — Z3490 Encounter for supervision of normal pregnancy, unspecified, unspecified trimester: Secondary | ICD-10-CM

## 2015-02-28 DIAGNOSIS — Z36 Encounter for antenatal screening of mother: Secondary | ICD-10-CM | POA: Diagnosis not present

## 2015-02-28 DIAGNOSIS — O09211 Supervision of pregnancy with history of pre-term labor, first trimester: Secondary | ICD-10-CM

## 2015-02-28 DIAGNOSIS — Z363 Encounter for antenatal screening for malformations: Secondary | ICD-10-CM

## 2015-02-28 DIAGNOSIS — Z3492 Encounter for supervision of normal pregnancy, unspecified, second trimester: Secondary | ICD-10-CM

## 2015-02-28 DIAGNOSIS — Z1389 Encounter for screening for other disorder: Secondary | ICD-10-CM

## 2015-02-28 LAB — POCT URINALYSIS DIPSTICK
Blood, UA: NEGATIVE
GLUCOSE UA: NEGATIVE
Ketones, UA: NEGATIVE
Leukocytes, UA: NEGATIVE
Nitrite, UA: NEGATIVE
Protein, UA: NEGATIVE

## 2015-02-28 NOTE — Progress Notes (Addendum)
[place] 18+6WKS,measurements c/w dates efw 301g,post pl gr 0,svp of fluid 4.7,normal ov's bilat,cephalic,cx 3.7 cm,fht 149bpm,anatomy complete no obvious abn seen

## 2015-02-28 NOTE — Progress Notes (Signed)
[id number] [redacted]w[redacted]d Estimated Date of Delivery: 07/26/15  Blood pressure 118/72, pulse 68, weight 172 lb 6.4 oz (78.2 kg), last menstrual period 10/19/2014, unknown if currently breastfeeding.   BP weight and urine results all reviewed and noted.  Please refer to the obstetrical flow sheet for the fundal height and fetal heart rate documentation: Normal anatomy scan today.   Patient reports good fetal movement, denies any bleeding and no rupture of membranes symptoms or regular contractions. Patient is without complaints. All questions were answered.  Orders Placed This Encounter  Procedures  . POCT urinalysis dipstick    Plan:  Continued routine obstetrical care,   Return in about 4 weeks (around 03/28/2015) for LROB.

## 2015-03-01 ENCOUNTER — Encounter: Payer: Commercial Managed Care - PPO | Admitting: Advanced Practice Midwife

## 2015-03-01 ENCOUNTER — Other Ambulatory Visit: Payer: Commercial Managed Care - PPO

## 2015-03-29 ENCOUNTER — Encounter: Payer: Self-pay | Admitting: Advanced Practice Midwife

## 2015-03-29 ENCOUNTER — Ambulatory Visit (INDEPENDENT_AMBULATORY_CARE_PROVIDER_SITE_OTHER): Payer: Commercial Managed Care - PPO | Admitting: Advanced Practice Midwife

## 2015-03-29 VITALS — BP 100/70 | HR 80 | Wt 174.3 lb

## 2015-03-29 DIAGNOSIS — O09211 Supervision of pregnancy with history of pre-term labor, first trimester: Secondary | ICD-10-CM

## 2015-03-29 DIAGNOSIS — Z3490 Encounter for supervision of normal pregnancy, unspecified, unspecified trimester: Secondary | ICD-10-CM

## 2015-03-29 DIAGNOSIS — O09891 Supervision of other high risk pregnancies, first trimester: Secondary | ICD-10-CM

## 2015-03-29 DIAGNOSIS — Z331 Pregnant state, incidental: Secondary | ICD-10-CM

## 2015-03-29 DIAGNOSIS — Z3492 Encounter for supervision of normal pregnancy, unspecified, second trimester: Secondary | ICD-10-CM

## 2015-03-29 DIAGNOSIS — Z1389 Encounter for screening for other disorder: Secondary | ICD-10-CM

## 2015-03-29 LAB — POCT URINALYSIS DIPSTICK
Glucose, UA: NEGATIVE
KETONES UA: NEGATIVE
LEUKOCYTES UA: NEGATIVE
Nitrite, UA: NEGATIVE
Protein, UA: NEGATIVE
RBC UA: NEGATIVE

## 2015-03-29 NOTE — Patient Instructions (Signed)

## 2015-03-29 NOTE — Progress Notes (Signed)
[id number] Estimated Date of Delivery: 07/26/15  Blood pressure 100/70, pulse 80, weight 174 lb 4.8 oz (79.062 kg), last menstrual period 10/19/2014, unknown if currently breastfeeding.   BP weight and urine results all reviewed and noted.  Please refer to the obstetrical flow sheet for the fundal height and fetal heart rate documentation:  Patient reports good fetal movement, denies any bleeding and no rupture of membranes symptoms or regular contractions. Patient is without complaints. All questions were answered.  Orders Placed This Encounter  Procedures  . POCT urinalysis dipstick    Plan:  Continued routine obstetrical care,   Return for PN2/LROB. flu shot

## 2015-04-24 ENCOUNTER — Other Ambulatory Visit: Payer: Commercial Managed Care - PPO

## 2015-04-24 ENCOUNTER — Ambulatory Visit (INDEPENDENT_AMBULATORY_CARE_PROVIDER_SITE_OTHER): Payer: Commercial Managed Care - PPO | Admitting: Women's Health

## 2015-04-24 ENCOUNTER — Encounter: Payer: Self-pay | Admitting: Women's Health

## 2015-04-24 VITALS — BP 126/72 | HR 76 | Wt 177.0 lb

## 2015-04-24 DIAGNOSIS — Z3492 Encounter for supervision of normal pregnancy, unspecified, second trimester: Secondary | ICD-10-CM

## 2015-04-24 DIAGNOSIS — Z131 Encounter for screening for diabetes mellitus: Secondary | ICD-10-CM

## 2015-04-24 DIAGNOSIS — Z1389 Encounter for screening for other disorder: Secondary | ICD-10-CM

## 2015-04-24 DIAGNOSIS — Z369 Encounter for antenatal screening, unspecified: Secondary | ICD-10-CM

## 2015-04-24 DIAGNOSIS — Z331 Pregnant state, incidental: Secondary | ICD-10-CM

## 2015-04-24 LAB — POCT URINALYSIS DIPSTICK
Blood, UA: NEGATIVE
Glucose, UA: NEGATIVE
Ketones, UA: NEGATIVE
Leukocytes, UA: NEGATIVE
Nitrite, UA: NEGATIVE
Protein, UA: NEGATIVE

## 2015-04-24 NOTE — Patient Instructions (Addendum)
Call the office ([phone]) or go to [facility] if:  You begin to have strong, frequent contractions  Your water breaks.  Sometimes it is a big gush of fluid, sometimes it is just a trickle that keeps getting your panties wet or running down your legs  You have vaginal bleeding.  It is normal to have a small amount of spotting if your cervix was checked.   You don't feel your baby moving like normal.  If you don't, get you something to eat and drink and lay down and focus on feeling your baby move.  You should feel at least 10 movements in 2 hours.  If you don't, you should call the office or go to [facility].    Tdap Vaccine  It is recommended that you get the Tdap vaccine during the third trimester of EACH pregnancy to help protect your baby from getting pertussis (whooping cough)  27-36 weeks is the BEST time to do this so that you can pass the protection on to your baby. During pregnancy is better than after pregnancy, but if you are unable to get it during pregnancy it will be offered at the hospital.   You can get this vaccine at the health department or your family doctor  Everyone who will be around your baby should also be up-to-date on their vaccines. Adults (who are not pregnant) only need 1 dose of Tdap during adulthood.   [place] Pediatricians/Family Doctors:  [facility] [phone]            [facility] [phone]                 [facility] [phone] (usually not accepting new patients unless you have family there already, you are always welcome to call and ask)            [facility] ([address] [place]) [phone]   [place] Pediatricians/Family Doctors:   [facility]: [phone]  [facility]: [phone]    Third Trimester of Pregnancy The third trimester is from week 29 through week 42, months 7 through 9. The third trimester is a time when the  fetus is growing rapidly. At the end of the ninth month, the fetus is about 20 inches in length and weighs 6-10 pounds.  BODY CHANGES Your body goes through many changes during pregnancy. The changes vary from woman to woman.  9. Your weight will continue to increase. You can expect to gain 25-35 pounds (11-16 kg) by the end of the pregnancy. 10. You may begin to get stretch marks on your hips, abdomen, and breasts. 11. You may urinate more often because the fetus is moving lower into your pelvis and pressing on your bladder. 12. You may develop or continue to have heartburn as a result of your pregnancy. 13. You may develop constipation because certain hormones are causing the muscles that push waste through your intestines to slow down. 14. You may develop hemorrhoids or swollen, bulging veins (varicose veins). 15. You may have pelvic pain because of the weight gain and pregnancy hormones relaxing your joints between the bones in your pelvis. Backaches may result from overexertion of the muscles supporting your posture. 16. You may have changes in your hair. These can include thickening of your hair, rapid growth, and changes in texture. Some women also have hair loss during or after pregnancy, or hair that feels dry or thin. Your hair will most likely return to normal after   your baby is born. 17. Your breasts will continue to grow and be tender. A yellow discharge may leak from your breasts called colostrum. 18. Your belly button may stick out. 19. You may feel short of breath because of your expanding uterus. 20. You may notice the fetus "dropping," or moving lower in your abdomen. 21. You may have a bloody mucus discharge. This usually occurs a few days to a week before labor begins. 22. Your cervix becomes thin and soft (effaced) near your due date. WHAT TO EXPECT AT YOUR PRENATAL EXAMS  You will have prenatal exams every 2 weeks until week 36. Then, you will have weekly prenatal exams.  During a routine prenatal visit: 3. You will be weighed to make sure you and the fetus are growing normally. 4. Your blood pressure is taken. 5. Your abdomen will be measured to track your baby's growth. 6. The fetal heartbeat will be listened to. 7. Any test results from the previous visit will be discussed. 8. You may have a cervical check near your due date to see if you have effaced. At around 36 weeks, your caregiver will check your cervix. At the same time, your caregiver will also perform a test on the secretions of the vaginal tissue. This test is to determine if a type of bacteria, Group B streptococcus, is present. Your caregiver will explain this further. Your caregiver may ask you: 2. What your birth plan is. 3. How you are feeling. 4. If you are feeling the baby move. 5. If you have had any abnormal symptoms, such as leaking fluid, bleeding, severe headaches, or abdominal cramping. 6. If you have any questions. Other tests or screenings that may be performed during your third trimester include: 2. Blood tests that check for low iron levels (anemia). 3. Fetal testing to check the health, activity level, and growth of the fetus. Testing is done if you have certain medical conditions or if there are problems during the pregnancy. FALSE LABOR You may feel small, irregular contractions that eventually go away. These are called Braxton Hicks contractions, or false labor. Contractions may last for hours, days, or even weeks before true labor sets in. If contractions come at regular intervals, intensify, or become painful, it is best to be seen by your caregiver.  SIGNS OF LABOR  3. Menstrual-like cramps. 4. Contractions that are 5 minutes apart or less. 5. Contractions that start on the top of the uterus and spread down to the lower abdomen and back. 6. A sense of increased pelvic pressure or back pain. 7. A watery or bloody mucus discharge that comes from the vagina. If you have any  of these signs before the 37th week of pregnancy, call your caregiver right away. You need to go to the hospital to get checked immediately. HOME CARE INSTRUCTIONS   Avoid all smoking, herbs, alcohol, and unprescribed drugs. These chemicals affect the formation and growth of the baby.  Follow your caregiver's instructions regarding medicine use. There are medicines that are either safe or unsafe to take during pregnancy.  Exercise only as directed by your caregiver. Experiencing uterine cramps is a good sign to stop exercising.  Continue to eat regular, healthy meals.  Wear a good support bra for breast tenderness.  Do not use hot tubs, steam rooms, or saunas.  Wear your seat belt at all times when driving.  Avoid raw meat, uncooked cheese, cat litter boxes, and soil used by cats. These carry germs that can cause birth   defects in the baby.  Take your prenatal vitamins.  Try taking a stool softener (if your caregiver approves) if you develop constipation. Eat more high-fiber foods, such as fresh vegetables or fruit and whole grains. Drink plenty of fluids to keep your urine clear or pale yellow.  Take warm sitz baths to soothe any pain or discomfort caused by hemorrhoids. Use hemorrhoid cream if your caregiver approves.  If you develop varicose veins, wear support hose. Elevate your feet for 15 minutes, 3-4 times a day. Limit salt in your diet.  Avoid heavy lifting, wear low heal shoes, and practice good posture.  Rest a lot with your legs elevated if you have leg cramps or low back pain.  Visit your dentist if you have not gone during your pregnancy. Use a soft toothbrush to brush your teeth and be gentle when you floss.  A sexual relationship may be continued unless your caregiver directs you otherwise.  Do not travel far distances unless it is absolutely necessary and only with the approval of your caregiver.  Take prenatal classes to understand, practice, and ask questions  about the labor and delivery.  Make a trial run to the hospital.  Pack your hospital bag.  Prepare the baby's nursery.  Continue to go to all your prenatal visits as directed by your caregiver. SEEK MEDICAL CARE IF:  You are unsure if you are in labor or if your water has broken.  You have dizziness.  You have mild pelvic cramps, pelvic pressure, or nagging pain in your abdominal area.  You have persistent nausea, vomiting, or diarrhea.  You have a bad smelling vaginal discharge.  You have pain with urination. SEEK IMMEDIATE MEDICAL CARE IF:   You have a fever.  You are leaking fluid from your vagina.  You have spotting or bleeding from your vagina.  You have severe abdominal cramping or pain.  You have rapid weight loss or gain.  You have shortness of breath with chest pain.  You notice sudden or extreme swelling of your face, hands, ankles, feet, or legs.  You have not felt your baby move in over an hour.  You have severe headaches that do not go away with medicine.  You have vision changes. Document Released: 05/20/2001 Document Revised: 05/31/2013 Document Reviewed: 07/27/2012 ExitCare Patient Information 2015 [employer]. This information is not intended to replace advice given to you by your health care provider. Make sure you discuss any questions you have with your health care provider.   

## 2015-04-24 NOTE — Progress Notes (Signed)
Low-risk OB appointment G2P0101 [id number] Estimated Date of Delivery: 07/26/15 BP 126/72 mmHg  Pulse 76  Wt 177 lb (80.287 kg)  LMP 10/19/2014 (Exact Date)  BP, weight, and urine reviewed.  Refer to obstetrical flow sheet for FH & FHR.  Reports good fm.  Denies regular uc's, lof, vb, or uti s/s. No complaints. Answered many questions, thinking about waterbirth.  Reviewed ptl s/s, fkc. Recommended Tdap at HD/PCP per CDC guidelines.  Plan:  Continue routine obstetrical care  F/U in 4wks for OB appointment  PN2 today, has decided she doesn't want flu shot

## 2015-04-25 ENCOUNTER — Emergency Department (HOSPITAL_COMMUNITY)
Admission: EM | Admit: 2015-04-25 | Discharge: 2015-04-26 | Disposition: A | Discharge status: Home / Self Care | Payer: Commercial Managed Care - PPO | Attending: Emergency Medicine | Admitting: Emergency Medicine

## 2015-04-25 ENCOUNTER — Encounter (HOSPITAL_COMMUNITY): Payer: Self-pay | Admitting: Emergency Medicine

## 2015-04-25 DIAGNOSIS — O9989 Other specified diseases and conditions complicating pregnancy, childbirth and the puerperium: Secondary | ICD-10-CM | POA: Diagnosis not present

## 2015-04-25 DIAGNOSIS — Z79899 Other long term (current) drug therapy: Secondary | ICD-10-CM | POA: Diagnosis not present

## 2015-04-25 DIAGNOSIS — N930 Postcoital and contact bleeding: Secondary | ICD-10-CM | POA: Diagnosis not present

## 2015-04-25 DIAGNOSIS — O4693 Antepartum hemorrhage, unspecified, third trimester: Secondary | ICD-10-CM | POA: Diagnosis not present

## 2015-04-25 DIAGNOSIS — Z87891 Personal history of nicotine dependence: Secondary | ICD-10-CM | POA: Insufficient documentation

## 2015-04-25 DIAGNOSIS — Z3A27 27 weeks gestation of pregnancy: Secondary | ICD-10-CM | POA: Insufficient documentation

## 2015-04-25 LAB — GLUCOSE TOLERANCE, 2 HOURS W/ 1HR
Glucose, 1 hour: 116 mg/dL (ref 65–179)
Glucose, 2 hour: 86 mg/dL (ref 65–152)
Glucose, Fasting: 78 mg/dL (ref 65–91)

## 2015-04-25 LAB — RPR: RPR Ser Ql: NONREACTIVE

## 2015-04-25 LAB — CBC
HEMATOCRIT: 36 % (ref 34.0–46.6)
Hemoglobin: 12.1 g/dL (ref 11.1–15.9)
MCH: 31.4 pg (ref 26.6–33.0)
MCHC: 33.6 g/dL (ref 31.5–35.7)
MCV: 94 fL (ref 79–97)
Platelets: 320 10*3/uL (ref 150–379)
RBC: 3.85 x10E6/uL (ref 3.77–5.28)
RDW: 13.6 % (ref 12.3–15.4)
WBC: 12.4 10*3/uL — ABNORMAL HIGH (ref 3.4–10.8)

## 2015-04-25 LAB — ANTIBODY SCREEN: Antibody Screen: NEGATIVE

## 2015-04-25 LAB — HIV ANTIBODY (ROUTINE TESTING W REFLEX): HIV Screen 4th Generation wRfx: NONREACTIVE

## 2015-04-25 NOTE — ED Notes (Signed)
Pt c/o having vaginal bleeding after having sexual intercourse tonight.

## 2015-04-25 NOTE — ED Provider Notes (Signed)
CSN: [id number]     Arrival date & time 04/25/15  2330 History   First MD Initiated Contact with Patient 04/25/15 2344     Chief Complaint  Patient presents with  . Vaginal Bleeding     (Consider location/radiation/quality/duration/timing/severity/associated sxs/prior Treatment) HPI patient is G2 P1, EDC 07/26/2015. She reports tonight right after having sex she started having vaginal bleeding. She states this happened about 15 minutes prior to arrival to the ED. She states she would describe it as more than spotting but cannot tell if it was like a period amount of bleeding. She states it was bright red blood. She does not have any pain. She reports normal pregnancy before this with some morning sickness the first trimester. She also was seen by her OB last week and states she started noticing some nighttime abdominal tightness and didn't know if it was Braxton-Hicks or not. She denies any nausea or vomiting now. She reports her blood type is O+. She reports feeling normal fetal movement since she has laid down on the ED stretcher.   [facility]   Past Medical History  Diagnosis Date  . No pertinent past medical history    Past Surgical History  Procedure Laterality Date  . Wisdom tooth extraction     Family History  Problem Relation Age of Onset  . Other Father     blood clot on lung  . Stroke Maternal Grandmother   . Cancer Paternal Grandmother     breast  . Dementia Paternal Grandfather    Social History  Substance Use Topics  . Smoking status: Former Smoker    Types: Cigarettes  . Smokeless tobacco: Never Used  . Alcohol Use: No     Comment: not now   Stay-at-home mom  OB History    Gravida Para Term Preterm AB TAB SAB Ectopic Multiple Living       Review of Systems  All other systems reviewed and are negative.     Allergies  Review of patient's allergies indicates no known allergies.  Home Medications   Prior to Admission  medications   Medication Sig Start Date End Date Taking? Authorizing Provider  Prenatal Vit-Fe Fumarate-FA (PRENATAL VITAMIN PO) Take by mouth daily.   Yes Historical Provider, MD  UNABLE TO FIND Proactive for face. Uses it once a day.    Historical Provider, MD   BP 134/63 mmHg  Pulse 83  Temp(Src) 97.7 F (36.5 C) (Oral)  Resp 18  Ht  (1.626 m)  Wt 177 lb (80.287 kg)  BMI 30.37 kg/m2  SpO2 100%  LMP 10/19/2014 (Exact Date)  Vital signs normal   Physical Exam  Constitutional: She is oriented to person, place, and time. She appears well-developed and well-nourished.  Non-toxic appearance. She does not appear ill. No distress.  HENT:  Head: Normocephalic and atraumatic.  Right Ear: External ear normal.  Left Ear: External ear normal.  Nose: Nose normal. No mucosal edema or rhinorrhea.  Mouth/Throat: Oropharynx is clear and moist and mucous membranes are normal. No dental abscesses or uvula swelling.  Eyes: Conjunctivae and EOM are normal. Pupils are equal, round, and reactive to light.  Neck: Normal range of motion and full passive range of motion without pain. Neck supple.  Cardiovascular: Normal rate, regular rhythm and normal heart sounds.  Exam reveals no gallop and no friction rub.   No murmur heard. Pulmonary/Chest: Effort normal and breath sounds normal.  No respiratory distress. She has no wheezes. She has no rhonchi. She has no rales. She exhibits no tenderness and no crepitus.  Abdominal: Soft. Normal appearance and bowel sounds are normal. She exhibits no distension. There is no tenderness. There is no rebound and no guarding.  Abdomen consistent with dates and is nontender. There is fetal movement felt.  Genitourinary:  Patient is noted to have blood on her proximal inner thighs, there is a moderate amount of blood in her vault. Large swabs were used to empty the blood from her vault. The cervix was observed and appears to be closed. On bimanual exam with sterile  gloves her cervix was closed.  Musculoskeletal: Normal range of motion. She exhibits no edema or tenderness.  Moves all extremities well.   Neurological: She is alert and oriented to person, place, and time. She has normal strength. No cranial nerve deficit.  Skin: Skin is warm, dry and intact. No rash noted. No erythema. No pallor.  Psychiatric: She has a normal mood and affect. Her speech is normal and behavior is normal. Her mood appears not anxious.  Nursing note and vitals reviewed.   ED Course  Procedures (including critical care time)  Patient was placed on a fetal monitor. Her heart rate during my interview was in the 140 to 150s range.  Review of her prior OB visits shows she had ultrasound on September 21. Her due date at that time was estimated to be February 16. Her ultrasound was normal. Specifically her placenta was noted to be posterior and a grade 0.  Review of her blood work verify she is O+.  [facility] 00:06 has reviewed her fetal monitoring strips, states fetal activity normal. No uterine activity, states follow up in office if bleeding continues.  Patient informed of her monitoring results. We discussed pelvic rest until she is cleared by her OB doctor. She is to call the office this morning and let them know about her ED visit. If she continues to have bleeding she should be seen today, however they may want to bump up her next routine appointment due to the bleeding if it resolves.Marland Kitchen.    MDM   Final diagnoses:  Vaginal bleeding in pregnancy, third trimester  PCB (post coital bleeding)   Plan discharge  Devoria AlbeIva Tyrhonda Georgiades, MD, [facility]oFACE[facility]    Esias Mory, MD 04/26/15 0500

## 2015-04-26 ENCOUNTER — Ambulatory Visit (INDEPENDENT_AMBULATORY_CARE_PROVIDER_SITE_OTHER): Payer: Commercial Managed Care - PPO

## 2015-04-26 ENCOUNTER — Other Ambulatory Visit: Payer: Self-pay | Admitting: Women's Health

## 2015-04-26 ENCOUNTER — Ambulatory Visit (INDEPENDENT_AMBULATORY_CARE_PROVIDER_SITE_OTHER): Payer: Commercial Managed Care - PPO | Admitting: Women's Health

## 2015-04-26 ENCOUNTER — Encounter: Payer: Self-pay | Admitting: Women's Health

## 2015-04-26 VITALS — BP 122/72 | HR 86 | Wt 178.0 lb

## 2015-04-26 DIAGNOSIS — O4692 Antepartum hemorrhage, unspecified, second trimester: Secondary | ICD-10-CM

## 2015-04-26 DIAGNOSIS — O09891 Supervision of other high risk pregnancies, first trimester: Secondary | ICD-10-CM

## 2015-04-26 DIAGNOSIS — Z331 Pregnant state, incidental: Secondary | ICD-10-CM

## 2015-04-26 DIAGNOSIS — O09211 Supervision of pregnancy with history of pre-term labor, first trimester: Principal | ICD-10-CM

## 2015-04-26 DIAGNOSIS — Z3492 Encounter for supervision of normal pregnancy, unspecified, second trimester: Secondary | ICD-10-CM

## 2015-04-26 DIAGNOSIS — Z1389 Encounter for screening for other disorder: Secondary | ICD-10-CM

## 2015-04-26 LAB — POCT URINALYSIS DIPSTICK
Glucose, UA: NEGATIVE
Ketones, UA: NEGATIVE
NITRITE UA: NEGATIVE

## 2015-04-26 NOTE — Patient Instructions (Signed)
No sex for at least 7 days after you have seen any kind of color to the discharge Plenty of fluids, water Rest, take it easy  Call the office [phone]) or [facility] if:  You begin to have strong, frequent contractions  Your water breaks.  Sometimes it is a big gush of fluid, sometimes it is just a trickle that keeps getting your panties wet or running down your legs  You have vaginal bleeding.  It is normal to have a small amount of spotting if your cervix was checked.   You don't feel your baby moving like normal.  If you don't, get you something to eat and drink and lay down and focus on feeling your baby move.  You should feel at least 10 movements in 2 hours.  If you don't, you should call the office or [facility].

## 2015-04-26 NOTE — Progress Notes (Signed)
US w/TV: 27 wks,cephalic,post fundal pl gr 1,fhr 142 bpm,cx 3.3cm,svp of fluid 4.4cm,EFW 1112g 62%,normal ov's bilat,prominent renal pelvis but wnl LK 4.415mm,RK 3mm

## 2015-04-26 NOTE — ED Notes (Signed)
Patient given discharge instruction, verbalized understand. Patient ambulatory out of the department.  

## 2015-04-26 NOTE — Progress Notes (Signed)
Work-in Low-risk OB appointment, F/U from ED visit last night G2P0101 [id number] Estimated Date of Delivery: 07/26/15 BP 122/72 mmHg  Pulse 86  Wt 178 lb (80.74 kg)  LMP 10/19/2014 (Exact Date)  BP, weight, and urine reviewed.  Refer to obstetrical flow sheet for FH & FHR.  Reports good fm.  Denies regular uc's, lof, uti s/s, abnormal d/c, itching/odor/irritation. Bright red VB after sex last night, heavier than spotting. Had slowed by time she went to ED, was monitored and everything normal. No US.  Last US here @ 20wks placenta posterior and no mention of previa/low-lying.  Spec exam: cx visually ~1cm, small amount dark red mucousy blood, no active bleeding from os, unable to do fFN d/t vb, wet prep neg SVE: outer os 1-2/inner os closed, posterior, feels thick, vtx -1 Reviewed ptl s/s, fkc, reasons to seek care. Pelvic rest for at least 7d from seeing any color to d/c, increase po hydration, rest Work-in today for u/s to assess placenta/CL F/U as scheduled for OB appointment  1350: US w/o evidence of abruption, placenta posterior fundal, CL 3.3cm

## 2015-04-26 NOTE — Discharge Instructions (Signed)
Dr Emelda FearFerguson wants you to call the office if you continue to have bleeding to be re-seen later today.  You can also call if you have other concerns. NO MORE SEX until you are seen in the office again.    Vaginal Bleeding During Pregnancy, Third Trimester A small amount of bleeding (spotting) from the vagina is relatively common in pregnancy. Various things can cause bleeding or spotting in pregnancy. Sometimes the bleeding is normal and is not a problem. However, bleeding during the third trimester can also be a sign of something serious for the mother and the baby. Be sure to tell your health care provider about any vaginal bleeding right away.  Some possible causes of vaginal bleeding during the third trimester include:   The placenta may be partially or completely covering the opening to the cervix (placenta previa).   The placenta may have separated from the uterus (abruption of the placenta).   There may be an infection or growth on the cervix.   You may be starting labor, called discharging of the mucus plug.   The placenta may grow into the muscle layer of the uterus (placenta accreta).  HOME CARE INSTRUCTIONS  Watch your condition for any changes. The following actions may help to lessen any discomfort you are feeling:   Follow your health care provider's instructions for limiting your activity. If your health care provider orders bed rest, you may need to stay in bed and only get up to use the bathroom. However, your health care provider may allow you to continue light activity.  If needed, make plans for someone to help with your regular activities and responsibilities while you are on bed rest.  Keep track of the number of pads you use each day, how often you change pads, and how soaked (saturated) they are. Write this down.  Do not use tampons. Do not douche.  Do not have sexual intercourse or orgasms until approved by your health care provider.  Follow your health care  provider's advice about lifting, driving, and physical activities.  If you pass any tissue from your vagina, save the tissue so you can show it to your health care provider.   Only take over-the-counter or prescription medicines as directed by your health care provider.  Do not take aspirin because it can make you bleed.   Keep all follow-up appointments as directed by your health care provider. SEEK MEDICAL CARE IF:  You have any vaginal bleeding during any part of your pregnancy.  You have cramps or labor pains.  You have a fever, not controlled by medicine. SEEK IMMEDIATE MEDICAL CARE IF:   You have severe cramps or pain in your back or belly (abdomen).  You have chills.  You have a gush of fluid from the vagina.  You pass large clots or tissue from your vagina.  Your bleeding increases.  You feel light-headed or weak.  You pass out.  You feel less movement or no movement of the baby.  MAKE SURE YOU:  Understand these instructions.  Will watch your condition.  Will get help right away if you are not doing well or get worse.   This information is not intended to replace advice given to you by your health care provider. Make sure you discuss any questions you have with your health care provider.   Document Released: 08/16/2002 Document Revised: 05/31/2013 Document Reviewed: 01/31/2013 Elsevier Interactive Patient Education [employer]2016 Else[employer] Inc.

## 2015-04-26 NOTE — Progress Notes (Signed)
G2P1, 27weeks, c/o bleeding after intercourse. FHT 145bpm, accelerations, no decelerations (Cat 1 tracing). Dr. Emelda FearFerguson reviewed tracing, pt history, discussed w/Dr. Lynelle DoctorKnapp. OK to d/c home with follow up in office per Dr Emelda FearFerguson.

## 2015-04-26 NOTE — ED Notes (Signed)
[facility] called to discharge pr from fetal monitor.

## 2015-05-22 ENCOUNTER — Encounter: Payer: Self-pay | Admitting: Women's Health

## 2015-05-22 ENCOUNTER — Ambulatory Visit (INDEPENDENT_AMBULATORY_CARE_PROVIDER_SITE_OTHER): Payer: Commercial Managed Care - PPO | Admitting: Women's Health

## 2015-05-22 VITALS — BP 122/66 | HR 80 | Wt 182.0 lb

## 2015-05-22 DIAGNOSIS — Z331 Pregnant state, incidental: Secondary | ICD-10-CM

## 2015-05-22 DIAGNOSIS — Z3A31 31 weeks gestation of pregnancy: Secondary | ICD-10-CM

## 2015-05-22 DIAGNOSIS — Z3493 Encounter for supervision of normal pregnancy, unspecified, third trimester: Secondary | ICD-10-CM

## 2015-05-22 DIAGNOSIS — Z1389 Encounter for screening for other disorder: Secondary | ICD-10-CM

## 2015-05-22 DIAGNOSIS — Z3483 Encounter for supervision of other normal pregnancy, third trimester: Secondary | ICD-10-CM

## 2015-05-22 LAB — POCT URINALYSIS DIPSTICK
Glucose, UA: NEGATIVE
KETONES UA: NEGATIVE
Nitrite, UA: NEGATIVE
RBC UA: NEGATIVE

## 2015-05-22 NOTE — Patient Instructions (Addendum)
Call the office ([phone]) or go to [facility] if:  You begin to have strong, frequent contractions  Your water breaks.  Sometimes it is a big gush of fluid, sometimes it is just a trickle that keeps getting your panties wet or running down your legs  You have vaginal bleeding.  It is normal to have a small amount of spotting if your cervix was checked.   You don't feel your baby moving like normal.  If you don't, get you something to eat and drink and lay down and focus on feeling your baby move.  You should feel at least 10 movements in 2 hours.  If you don't, you should call the office or go to [facility].    Preterm Labor Information Preterm labor is when labor starts at less than 37 weeks of pregnancy. The normal length of a pregnancy is 39 to 41 weeks. CAUSES Often, there is no identifiable underlying cause as to why a woman goes into preterm labor. One of the most common known causes of preterm labor is infection. Infections of the uterus, cervix, vagina, amniotic sac, bladder, kidney, or even the lungs (pneumonia) can cause labor to start. Other suspected causes of preterm labor include:   Urogenital infections, such as yeast infections and bacterial vaginosis.   Uterine abnormalities (uterine shape, uterine septum, fibroids, or bleeding from the placenta).   A cervix that has been operated on (it may fail to stay closed).   Malformations in the fetus.   Multiple gestations (twins, triplets, and so on).   Breakage of the amniotic sac.  RISK FACTORS  Having a previous history of preterm labor.   Having premature rupture of membranes (PROM).   Having a placenta that covers the opening of the cervix (placenta previa).   Having a placenta that separates from the uterus (placental abruption).   Having a cervix that is too weak to hold the fetus in the uterus (incompetent cervix).   Having too much fluid in the amniotic sac (polyhydramnios).   Taking  illegal drugs or smoking while pregnant.   Not gaining enough weight while pregnant.   Being younger than 18 and older than 32 years old.   Having a low socioeconomic status.   Being African American. SYMPTOMS Signs and symptoms of preterm labor include:   Menstrual-like cramps, abdominal pain, or back pain.  Uterine contractions that are regular, as frequent as six in an hour, regardless of their intensity (may be mild or painful).  Contractions that start on the top of the uterus and spread down to the lower abdomen and back.   A sense of increased pelvic pressure.   A watery or bloody mucus discharge that comes from the vagina.  TREATMENT Depending on the length of the pregnancy and other circumstances, your health care provider may suggest bed rest. If necessary, there are medicines that can be given to stop contractions and to mature the fetal lungs. If labor happens before 34 weeks of pregnancy, a prolonged hospital stay may be recommended. Treatment depends on the condition of both you and the fetus.  WHAT SHOULD YOU DO IF YOU THINK YOU ARE IN PRETERM LABOR? Call your health care provider right away. You will need to go to the hospital to get checked immediately. HOW CAN YOU PREVENT PRETERM LABOR IN FUTURE PREGNANCIES? You should:   Stop smoking if you smoke.  Maintain healthy weight gain and avoid chemicals and drugs that are not necessary.  Be watchful for   any type of infection.  Inform your health care provider if you have a known history of preterm labor.   This information is not intended to replace advice given to you by your health care provider. Make sure you discuss any questions you have with your health care provider.   Document Released: 08/16/2003 Document Revised: 01/26/2013 Document Reviewed: 06/28/2012 Elsevier Interactive Patient Education 2016 Elsevier Inc.  

## 2015-05-22 NOTE — Progress Notes (Signed)
Low-risk OB appointment G2P0101 3322w5d Estimated Date of Delivery: 07/26/15 BP 122/66 mmHg  Pulse 80  Wt 182 lb (82.555 kg)  LMP 10/19/2014 (Exact Date)  BP, weight, and urine reviewed.  Refer to obstetrical flow sheet for FH & FHR.  Reports good fm.  Denies regular uc's, lof, uti s/s. Had scant amt bleeding 1wk after last visit, but went away spontaneously on it's own and none since. Wants to go to [facility] next week x 2 days w/ husband for his job. Recommended if has another episode of bleeding or any s/s ptl to not go. If all remains normal ok to go, take records w/ her, and get out of car q 1hr to walk around, exercise legs/feet while in car, don't cross legs to reduce r/f blood clot.  Reviewed ptl s/s, fkc. Plan:  Continue routine obstetrical care  F/U in 2wks for OB appointment

## 2015-06-05 ENCOUNTER — Ambulatory Visit (INDEPENDENT_AMBULATORY_CARE_PROVIDER_SITE_OTHER): Payer: Commercial Managed Care - PPO | Admitting: Women's Health

## 2015-06-05 ENCOUNTER — Encounter: Payer: Self-pay | Admitting: Women's Health

## 2015-06-05 VITALS — BP 130/80 | HR 84 | Wt 181.5 lb

## 2015-06-05 DIAGNOSIS — Z1389 Encounter for screening for other disorder: Secondary | ICD-10-CM

## 2015-06-05 DIAGNOSIS — Z3A33 33 weeks gestation of pregnancy: Secondary | ICD-10-CM

## 2015-06-05 DIAGNOSIS — Z331 Pregnant state, incidental: Secondary | ICD-10-CM

## 2015-06-05 DIAGNOSIS — Z3493 Encounter for supervision of normal pregnancy, unspecified, third trimester: Secondary | ICD-10-CM

## 2015-06-05 LAB — POCT URINALYSIS DIPSTICK
Blood, UA: NEGATIVE
GLUCOSE UA: NEGATIVE
KETONES UA: NEGATIVE
LEUKOCYTES UA: NEGATIVE
NITRITE UA: NEGATIVE
Protein, UA: NEGATIVE

## 2015-06-05 NOTE — Progress Notes (Signed)
Low-risk OB appointment G2P0101 1061w5d Estimated Date of Delivery: 07/26/15 BP 130/80 mmHg  Pulse 84  Wt 181 lb 8 oz (82.328 kg)  LMP 10/19/2014 (Exact Date)  BP, weight, and urine reviewed.  Refer to obstetrical flow sheet for FH & FHR.  Reports good fm.  Denies regular uc's, lof, vb, or uti s/s. No complaints.  Was able to go to [place], no problems. No further vb.  Reviewed ptl s/s, fkc. Plan:  Continue routine obstetrical care  F/U in 2wks for OB appointment

## 2015-06-05 NOTE — Patient Instructions (Signed)
Call the office ([phone]) or go to [facility] if:  You begin to have strong, frequent contractions  Your water breaks.  Sometimes it is a big gush of fluid, sometimes it is just a trickle that keeps getting your panties wet or running down your legs  You have vaginal bleeding.  It is normal to have a small amount of spotting if your cervix was checked.   You don't feel your baby moving like normal.  If you don't, get you something to eat and drink and lay down and focus on feeling your baby move.  You should feel at least 10 movements in 2 hours.  If you don't, you should call the office or go to [facility].    Preterm Labor Information Preterm labor is when labor starts at less than 37 weeks of pregnancy. The normal length of a pregnancy is 39 to 41 weeks. CAUSES Often, there is no identifiable underlying cause as to why a woman goes into preterm labor. One of the most common known causes of preterm labor is infection. Infections of the uterus, cervix, vagina, amniotic sac, bladder, kidney, or even the lungs (pneumonia) can cause labor to start. Other suspected causes of preterm labor include:   Urogenital infections, such as yeast infections and bacterial vaginosis.   Uterine abnormalities (uterine shape, uterine septum, fibroids, or bleeding from the placenta).   A cervix that has been operated on (it may fail to stay closed).   Malformations in the fetus.   Multiple gestations (twins, triplets, and so on).   Breakage of the amniotic sac.  RISK FACTORS  Having a previous history of preterm labor.   Having premature rupture of membranes (PROM).   Having a placenta that covers the opening of the cervix (placenta previa).   Having a placenta that separates from the uterus (placental abruption).   Having a cervix that is too weak to hold the fetus in the uterus (incompetent cervix).   Having too much fluid in the amniotic sac (polyhydramnios).   Taking  illegal drugs or smoking while pregnant.   Not gaining enough weight while pregnant.   Being younger than 18 and older than 32 years old.   Having a low socioeconomic status.   Being African American. SYMPTOMS Signs and symptoms of preterm labor include:   Menstrual-like cramps, abdominal pain, or back pain.  Uterine contractions that are regular, as frequent as six in an hour, regardless of their intensity (may be mild or painful).  Contractions that start on the top of the uterus and spread down to the lower abdomen and back.   A sense of increased pelvic pressure.   A watery or bloody mucus discharge that comes from the vagina.  TREATMENT Depending on the length of the pregnancy and other circumstances, your health care provider may suggest bed rest. If necessary, there are medicines that can be given to stop contractions and to mature the fetal lungs. If labor happens before 34 weeks of pregnancy, a prolonged hospital stay may be recommended. Treatment depends on the condition of both you and the fetus.  WHAT SHOULD YOU DO IF YOU THINK YOU ARE IN PRETERM LABOR? Call your health care provider right away. You will need to go to the hospital to get checked immediately. HOW CAN YOU PREVENT PRETERM LABOR IN FUTURE PREGNANCIES? You should:   Stop smoking if you smoke.  Maintain healthy weight gain and avoid chemicals and drugs that are not necessary.  Be watchful for   any type of infection.  Inform your health care provider if you have a known history of preterm labor.   This information is not intended to replace advice given to you by your health care provider. Make sure you discuss any questions you have with your health care provider.   Document Released: 08/16/2003 Document Revised: 01/26/2013 Document Reviewed: 06/28/2012 Elsevier Interactive Patient Education 2016 Elsevier Inc.  

## 2015-06-10 NOTE — L&D Delivery Note (Signed)
Patient is 35 y.o. G2P0101 [redacted]w[redacted]d admitted for SOL at [redacted]w[redacted]d. Patient received BMZ x 1 in MAU. GBS unknown-adequately treated with PCN. AROM at 2330 1/25.   Delivery Note At 12:23 AM a viable female was delivered via Vaginal, Spontaneous Delivery (Presentation:OA ; left  ).  APGAR: 9, 9; weight: pending.   Placenta status: intact.  Cord:3 vessel  with the following complications: .  Cord pH: not collected   Anesthesia:  none Episiotomy:  none Lacerations:  Periurethral abrasions hemostatic (not requiring repair) Suture Repair: n/a Est. Blood Loss (mL):  75ml  Mom to postpartum.  Baby to Couplet care / Skin to Skin.  Palma Holter 07/05/2015, 12:41 AM   Upon arrival patient was complete and pushing. She pushed with good maternal effort to deliver a healthy baby boy. Baby delivered without difficulty, was noted to have good tone and place on maternal abdomen for oral suctioning, drying and stimulation. Delayed cord clamping performed. Placenta delivered intact with 3V cord. Vaginal canal and perineum was inspected and hemostatic periurethral abrasions were noted that did not require suture repair. Pitocin was started and uterus massaged until bleeding slowed. Counts of sharps, instruments, and lap pads were all correct.   Palma Holter, MD PGY 1 Family Medicine   Patient is a [phone] at [redacted]w[redacted]d who was admitted w/ advanced cx dilation, significant hx of preterm delivery x 1 @ 35wks., but otherwise uncomplicated prenatal course.  She progressed with augmentation via AROM.  I was gloved and present for delivery in its entirety.  Second stage of labor progressed to SVD;  no decels during second stage noted.  Complications: none  Lacerations: none  EBL: 75cc  Cam Hai, CNM 9:17 AM  07/05/2015

## 2015-06-19 ENCOUNTER — Encounter: Payer: Self-pay | Admitting: Women's Health

## 2015-06-19 ENCOUNTER — Ambulatory Visit (INDEPENDENT_AMBULATORY_CARE_PROVIDER_SITE_OTHER): Payer: Commercial Managed Care - PPO | Admitting: Women's Health

## 2015-06-19 VITALS — BP 130/60 | HR 80 | Wt 183.0 lb

## 2015-06-19 DIAGNOSIS — Z3493 Encounter for supervision of normal pregnancy, unspecified, third trimester: Secondary | ICD-10-CM

## 2015-06-19 DIAGNOSIS — Z331 Pregnant state, incidental: Secondary | ICD-10-CM

## 2015-06-19 DIAGNOSIS — Z1389 Encounter for screening for other disorder: Secondary | ICD-10-CM

## 2015-06-19 LAB — POCT URINALYSIS DIPSTICK
Glucose, UA: NEGATIVE
Ketones, UA: NEGATIVE
NITRITE UA: NEGATIVE
PROTEIN UA: NEGATIVE
RBC UA: NEGATIVE

## 2015-06-19 NOTE — Patient Instructions (Signed)
Call the office ([phone]) or go to [facility] if:  You begin to have strong, frequent contractions  Your water breaks.  Sometimes it is a big gush of fluid, sometimes it is just a trickle that keeps getting your panties wet or running down your legs  You have vaginal bleeding.  It is normal to have a small amount of spotting if your cervix was checked.   You don't feel your baby moving like normal.  If you don't, get you something to eat and drink and lay down and focus on feeling your baby move.  You should feel at least 10 movements in 2 hours.  If you don't, you should call the office or go to [facility].    Preterm Labor Information Preterm labor is when labor starts at less than 37 weeks of pregnancy. The normal length of a pregnancy is 39 to 41 weeks. CAUSES Often, there is no identifiable underlying cause as to why a woman goes into preterm labor. One of the most common known causes of preterm labor is infection. Infections of the uterus, cervix, vagina, amniotic sac, bladder, kidney, or even the lungs (pneumonia) can cause labor to start. Other suspected causes of preterm labor include:   Urogenital infections, such as yeast infections and bacterial vaginosis.   Uterine abnormalities (uterine shape, uterine septum, fibroids, or bleeding from the placenta).   A cervix that has been operated on (it may fail to stay closed).   Malformations in the fetus.   Multiple gestations (twins, triplets, and so on).   Breakage of the amniotic sac.  RISK FACTORS  Having a previous history of preterm labor.   Having premature rupture of membranes (PROM).   Having a placenta that covers the opening of the cervix (placenta previa).   Having a placenta that separates from the uterus (placental abruption).   Having a cervix that is too weak to hold the fetus in the uterus (incompetent cervix).   Having too much fluid in the amniotic sac (polyhydramnios).   Taking  illegal drugs or smoking while pregnant.   Not gaining enough weight while pregnant.   Being younger than 18 and older than 33 years old.   Having a low socioeconomic status.   Being African American. SYMPTOMS Signs and symptoms of preterm labor include:   Menstrual-like cramps, abdominal pain, or back pain.  Uterine contractions that are regular, as frequent as six in an hour, regardless of their intensity (may be mild or painful).  Contractions that start on the top of the uterus and spread down to the lower abdomen and back.   A sense of increased pelvic pressure.   A watery or bloody mucus discharge that comes from the vagina.  TREATMENT Depending on the length of the pregnancy and other circumstances, your health care provider may suggest bed rest. If necessary, there are medicines that can be given to stop contractions and to mature the fetal lungs. If labor happens before 34 weeks of pregnancy, a prolonged hospital stay may be recommended. Treatment depends on the condition of both you and the fetus.  WHAT SHOULD YOU DO IF YOU THINK YOU ARE IN PRETERM LABOR? Call your health care provider right away. You will need to go to the hospital to get checked immediately. HOW CAN YOU PREVENT PRETERM LABOR IN FUTURE PREGNANCIES? You should:   Stop smoking if you smoke.  Maintain healthy weight gain and avoid chemicals and drugs that are not necessary.  Be watchful for   any type of infection.  Inform your health care provider if you have a known history of preterm labor.   This information is not intended to replace advice given to you by your health care provider. Make sure you discuss any questions you have with your health care provider.   Document Released: 08/16/2003 Document Revised: 01/26/2013 Document Reviewed: 06/28/2012 Elsevier Interactive Patient Education 2016 Elsevier Inc.  

## 2015-06-19 NOTE — Progress Notes (Signed)
Low-risk OB appointment G2P0101 5271w5d Estimated Date of Delivery: 07/26/15 BP 130/60 mmHg  Pulse 80  Wt 183 lb (83.008 kg)  LMP 10/19/2014 (Exact Date)  BP, weight, and urine reviewed.  Refer to obstetrical flow sheet for FH & FHR.  Reports good fm.  Denies regular uc's, lof, vb, or uti s/s. No complaints. Reviewed ptl s/s, fkc. Plan:  Continue routine obstetrical care  F/U in 2wks for OB appointment and gbs

## 2015-07-03 ENCOUNTER — Ambulatory Visit (INDEPENDENT_AMBULATORY_CARE_PROVIDER_SITE_OTHER): Payer: Commercial Managed Care - PPO | Admitting: Advanced Practice Midwife

## 2015-07-03 VITALS — BP 134/86 | HR 74 | Wt 184.4 lb

## 2015-07-03 DIAGNOSIS — Z3A37 37 weeks gestation of pregnancy: Secondary | ICD-10-CM

## 2015-07-03 DIAGNOSIS — Z3483 Encounter for supervision of other normal pregnancy, third trimester: Secondary | ICD-10-CM

## 2015-07-03 DIAGNOSIS — Z3685 Encounter for antenatal screening for Streptococcus B: Secondary | ICD-10-CM

## 2015-07-03 DIAGNOSIS — Z3493 Encounter for supervision of normal pregnancy, unspecified, third trimester: Secondary | ICD-10-CM

## 2015-07-03 DIAGNOSIS — Z369 Encounter for antenatal screening, unspecified: Secondary | ICD-10-CM

## 2015-07-03 DIAGNOSIS — Z331 Pregnant state, incidental: Secondary | ICD-10-CM

## 2015-07-03 DIAGNOSIS — Z1389 Encounter for screening for other disorder: Secondary | ICD-10-CM

## 2015-07-03 NOTE — Progress Notes (Signed)
[id number] [redacted]w[redacted]d Estimated Date of Delivery: 07/26/15  Blood pressure 134/86, pulse 74, weight 184 lb 6.4 oz (83.643 kg), last menstrual period 10/19/2014, unknown if currently breastfeeding.   BP weight and urine results all reviewed and noted.  Please refer to the obstetrical flow sheet for the fundal height and fetal heart rate documentation:  Patient reports good fetal movement, denies any bleeding and no rupture of membranes symptoms or regular contractions. Feels like she is having some irregular braxton hicks, nothing painful or consistent. Placed on EFM--had 1 contraction with some irritability. Recheck cx essentially the same, but very very soft Patient is without complaints. All questions were answered.  Orders Placed This Encounter  Procedures  . Strep Gp B NAA  . GC/Chlamydia Probe Amp  . POCT urinalysis dipstick    Plan:  Continued routine obstetrical care, GBS today.  Labor precautions  Return in about 1 week (around 07/10/2015) for LROB.

## 2015-07-03 NOTE — Patient Instructions (Signed)

## 2015-07-04 ENCOUNTER — Telehealth: Payer: Self-pay | Admitting: *Deleted

## 2015-07-04 ENCOUNTER — Inpatient Hospital Stay (HOSPITAL_COMMUNITY)
Admission: AD | Admit: 2015-07-04 | Discharge: 2015-07-07 | DRG: 775 | Disposition: A | Discharge status: Home / Self Care | Payer: Commercial Managed Care - PPO | Source: Ambulatory Visit | Attending: Obstetrics & Gynecology | Admitting: Obstetrics & Gynecology

## 2015-07-04 ENCOUNTER — Encounter (HOSPITAL_COMMUNITY): Payer: Self-pay

## 2015-07-04 DIAGNOSIS — Z87891 Personal history of nicotine dependence: Secondary | ICD-10-CM | POA: Diagnosis not present

## 2015-07-04 DIAGNOSIS — Z3A37 37 weeks gestation of pregnancy: Secondary | ICD-10-CM

## 2015-07-04 DIAGNOSIS — O09211 Supervision of pregnancy with history of pre-term labor, first trimester: Secondary | ICD-10-CM

## 2015-07-04 DIAGNOSIS — Z823 Family history of stroke: Secondary | ICD-10-CM

## 2015-07-04 DIAGNOSIS — Z3493 Encounter for supervision of normal pregnancy, unspecified, third trimester: Secondary | ICD-10-CM

## 2015-07-04 DIAGNOSIS — O09891 Supervision of other high risk pregnancies, first trimester: Secondary | ICD-10-CM

## 2015-07-04 DIAGNOSIS — IMO0001 Reserved for inherently not codable concepts without codable children: Secondary | ICD-10-CM

## 2015-07-04 HISTORY — DX: Other specified health status: Z78.9

## 2015-07-04 LAB — CBC
HCT: 36 % (ref 36.0–46.0)
Hemoglobin: 12.5 g/dL (ref 12.0–15.0)
MCH: 32.1 pg (ref 26.0–34.0)
MCHC: 34.7 g/dL (ref 30.0–36.0)
MCV: 92.3 fL (ref 78.0–100.0)
PLATELETS: 293 10*3/uL (ref 150–400)
RBC: 3.9 MIL/uL (ref 3.87–5.11)
RDW: 13.2 % (ref 11.5–15.5)
WBC: 13.4 10*3/uL — ABNORMAL HIGH (ref 4.0–10.5)

## 2015-07-04 LAB — GC/CHLAMYDIA PROBE AMP
Chlamydia trachomatis, NAA: NEGATIVE
NEISSERIA GONORRHOEAE BY PCR: NEGATIVE

## 2015-07-04 LAB — TYPE AND SCREEN
ABO/RH(D): O POS
ANTIBODY SCREEN: NEGATIVE

## 2015-07-04 MED ORDER — OXYCODONE-ACETAMINOPHEN 5-325 MG PO TABS: 2.0000 | ORAL_TABLET | ORAL | Status: DC | PRN

## 2015-07-04 MED ORDER — BETAMETHASONE SOD PHOS & ACET 6 (3-3) MG/ML IJ SUSP
12.0000 mg | Freq: Once | INTRAMUSCULAR | Status: AC
  Administered 2015-07-04: 12 mg via INTRAMUSCULAR
  Filled 2015-07-04: qty 2

## 2015-07-04 MED ORDER — LACTATED RINGERS IV SOLN
INTRAVENOUS | Status: DC
  Administered 2015-07-04: 11:00:00 via INTRAVENOUS

## 2015-07-04 MED ORDER — ACETAMINOPHEN 325 MG PO TABS: 650.0000 mg | ORAL_TABLET | ORAL | Status: DC | PRN

## 2015-07-04 MED ORDER — OXYTOCIN BOLUS FROM INFUSION: 500.0000 mL | INTRAVENOUS | Status: DC

## 2015-07-04 MED ORDER — LACTATED RINGERS IV SOLN: 500.0000 mL | INTRAVENOUS | Status: DC | PRN

## 2015-07-04 MED ORDER — CITRIC ACID-SODIUM CITRATE 334-500 MG/5ML PO SOLN: 30.0000 mL | ORAL | Status: DC | PRN

## 2015-07-04 MED ORDER — PENICILLIN G POTASSIUM 5000000 UNITS IJ SOLR
5.0000 10*6.[IU] | Freq: Once | INTRAVENOUS | Status: AC
  Administered 2015-07-04: 5 10*6.[IU] via INTRAVENOUS
  Filled 2015-07-04: qty 5

## 2015-07-04 MED ORDER — LIDOCAINE HCL (PF) 1 % IJ SOLN
30.0000 mL | INTRAMUSCULAR | Status: DC | PRN
  Filled 2015-07-04: qty 30

## 2015-07-04 MED ORDER — PENICILLIN G POTASSIUM 5000000 UNITS IJ SOLR
2.5000 10*6.[IU] | INTRAVENOUS | Status: DC
  Administered 2015-07-04 (×2): 2.5 10*6.[IU] via INTRAVENOUS
  Filled 2015-07-04 (×7): qty 2.5

## 2015-07-04 MED ORDER — OXYTOCIN 10 UNIT/ML IJ SOLN
2.5000 [IU]/h | INTRAVENOUS | Status: DC
  Filled 2015-07-04: qty 4

## 2015-07-04 MED ORDER — OXYCODONE-ACETAMINOPHEN 5-325 MG PO TABS: 1.0000 | ORAL_TABLET | ORAL | Status: DC | PRN

## 2015-07-04 MED ORDER — ONDANSETRON HCL 4 MG/2ML IJ SOLN: 4.0000 mg | Freq: Four times a day (QID) | INTRAMUSCULAR | Status: DC | PRN

## 2015-07-04 NOTE — MAU Note (Signed)
Having some contractions. Was 6 at last appt.

## 2015-07-04 NOTE — Progress Notes (Signed)
Labor Progress Note Kelsey Flores is a 36 y.o. G2P0101 at [redacted]w[redacted]d presented for SOL. S: Feeling contractions but minimal pain.  Eating clear liquids.   O:  BP 117/67 mmHg  Pulse 77  Temp(Src) 98.3 F (36.8 C) (Oral)  Resp 20  Ht  (1.676 m)  Wt 83.462 kg (184 lb)  BMI 29.71 kg/m2  LMP 10/19/2014 (Exact Date) EFM: 150s/good var/+accels  CVE: Dilation: 7 Effacement (%): 90 Cervical Position: Middle Station: 0 Presentation: Vertex Exam by:: cwicker,rnc   A&P: 35 y.o. G2P0101 [redacted]w[redacted]d with SOL #Labor: active, continue expectant mgmt, avoid augmentation due to PTL #Preterm @ 36.6w: s/p BMZ x1, PCN #Pain: prn analgesia #FWB: cat 1 #GBS: unknown, PCN  [name], MD 7:08 PM

## 2015-07-04 NOTE — Progress Notes (Signed)
Labor Progress Note  Kelsey Flores is a 35 y.o. G2P0101 at [redacted]w[redacted]d  admitted for SOL (preterm labor)   S: Doing well no concerns. Contractions still seem somewhat far apart for patient.   O:  BP 128/78 mmHg  Pulse 73  Temp(Src) 98.1 F (36.7 C) (Oral)  Resp 18  Ht  (1.676 m)  Wt 83.462 kg (184 lb)  BMI 29.71 kg/m2  LMP 10/19/2014 (Exact Date)     FHT:  FHR: 124 bpm, variability: moderate,  accelerations:  Present,  decelerations:  Absent UC:   Every 2-7 mins SVE:   Dilation: 7 Effacement (%): 90 Station: -1, 0 Exam by:: Pincus Badder, CNM AROM @ 2337   Labs: Lab Results  Component Value Date   WBC 13.4* 07/04/2015   HGB 12.5 07/04/2015   HCT 36.0 07/04/2015   MCV 92.3 07/04/2015   PLT 293 07/04/2015    Assessment / Plan: 35 y.o. G2P0101 [redacted]w[redacted]d presented with SOL with PTL. Contractions irregular. Pt is almost [redacted]wks GA (at midnight). AROM @ 2337.   Labor: Augmented with AROM since patient is 37w GA at midnight.  Fetal Wellbeing:  Category I Pain Control:  Labor support without medications Anticipated MOD:  NSVD  Expectant management  Palma Holter, MD PGY 1 Family Medicine

## 2015-07-04 NOTE — Progress Notes (Signed)
Labor Progress Note  Kelsey Flores is a 35 y.o. G2P0101 at [redacted]w[redacted]d  admitted for [facility] labor)   S: Doing well. No Concerns.   O:  BP 117/67 mmHg  Pulse 77  Temp(Src) 98.3 F (36.8 C) (Oral)  Resp 20  Ht  (1.676 m)  Wt 83.462 kg (184 lb)  BMI 29.71 kg/m2  LMP 10/19/2014 (Exact Date)     FHT:  FHR: 135 bpm, variability: moderate,  accelerations:  Present,  decelerations:  Absent UC:   Irregular, rare  SVE:   Dilation: 7 Effacement (%): 90 Station: 0 Exam by:: cwicker,rnc Intact Membranes   Labs: Lab Results  Component Value Date   WBC 13.4* 07/04/2015   HGB 12.5 07/04/2015   HCT 36.0 07/04/2015   MCV 92.3 07/04/2015   PLT 293 07/04/2015    Assessment / Plan: 35 y.o. G2P0101 [redacted]w[redacted]d active labor, however contractions are now irregular/rare. Expectant management. Hold off augmentation due to prematurity.   Labor: . Expectant management. Hold off augmentation due to prematurity.  Fetal Wellbeing:  Category I Pain Control:  Labor support without medications Anticipated MOD:  NSVD  Expectant management  Palma Holter, MD PGY 1 Family Medicine

## 2015-07-04 NOTE — H&P (Signed)
OBSTETRIC ADMISSION HISTORY AND PHYSICAL  Kelsey Flores is a 35 y.o. female G96P0101 with IUP at [redacted]w[redacted]d by L/8 presenting for preterm contractions. Patient was seen in office yesterday and was 5 cm and told to come to [facility] if ctx started. She reports she is having contractions, mild/mod in nature but is talking through them. She reports braxton hicks for the last week. She reports +FMs, No LOF, no VB, no blurry vision, headaches or peripheral edema, and RUQ pain.  She plans on breast feeding. She request condoms for birth control.  Dating: By Delena Bali --->  Estimated Date of Delivery: 07/26/15  [facility]  Initiated Care at  10 weeks  FOB [facility]  Dating By 1st trimester [place]  Pap Normal with neg HPV 7/16  GC/CT Initial:          -/-      36+wks:  Genetic Screen NT/IT: declined  CF screen Neg  Anatomic [place] Normal female, 'Ophelia Shoulder.'  Flu vaccine Declined  Tdap Recommended ~ 28wks  Glucose Screen  2 hr  78/116/86  GBS  UNK  Feed Preference breast  Contraception condoms  Circumcision yes  Childbirth Classes Declined  Pediatrician [facility]    Prenatal History/Complications:  Past Medical History: Past Medical History  Diagnosis Date  . No pertinent past medical history   . Medical history non-contributory     Past Surgical History: Past Surgical History  Procedure Laterality Date  . Wisdom tooth extraction      Obstetrical History: OB History    Gravida Para Term Preterm AB TAB SAB Ectopic Multiple Living        Social History: Social History   Social History  . Marital Status: Married    Spouse Name: N/A  . Number of Children: N/A  . Years of Education: N/A   Social History Main Topics  . Smoking status: Former Smoker    Types: Cigarettes  . Smokeless tobacco: Never Used  . Alcohol Use: No     Comment: not now  . Drug Use: No  . Sexual Activity: Yes    Birth Control/ Protection: None   Other Topics Concern  . None    Social History Narrative    Family History: Family History  Problem Relation Age of Onset  . Other Father     blood clot on lung  . Stroke Maternal Grandmother   . Cancer Paternal Grandmother     breast  . Dementia Paternal Grandfather     Allergies: No Known Allergies  Prescriptions prior to admission  Medication Sig Dispense Refill Last Dose  . calcium carbonate (TUMS - DOSED IN MG ELEMENTAL CALCIUM) 500 MG chewable tablet Chew 1 tablet by mouth 2 (two) times daily as needed for indigestion or heartburn.   07/04/2015 at Unknown time  . Prenatal Vit-Fe Fumarate-FA (PRENATAL VITAMIN PO) Take 1 tablet by mouth daily.    07/03/2015 at Unknown time     Review of Systems   All systems reviewed and negative except as stated in HPI  Blood pressure 133/89, pulse 88, temperature 98.2 F (36.8 C), temperature source Oral, resp. rate 18, height  (1.676 m), weight 184 lb (83.462 kg), last menstrual period 10/19/2014, unknown if currently breastfeeding. General appearance: alert, cooperative and appears stated age Lungs: clear to auscultation bilaterally Heart: regular rate and rhythm Abdomen: soft, non-tender; bowel sounds normal Pelvic: adequate Extremities: Homans sign is negative,  no sign of DVT  Presentation: cephalic Fetal monitoringBaseline: 130 bpm, Variability: Good {> 6 bpm), Accelerations: Reactive and Decelerations: Absent Uterine activityFrequency: Every 5 minutes Dilation: 7 Effacement (%): 90 Station: 0 Exam by:: cwicker,rnc   Prenatal labs: ABO, Rh: O/Positive/-- (07/14 1603) Antibody: Negative (11/15 0859) Rubella: IMMUNE RPR: Non Reactive (11/15 0859)  HBsAg: Negative (07/14 1603)  HIV: Non Reactive (11/15 0859)  GBS:    2 hr Glucola - PASSED Genetic screening  declined Anatomy US wnl  Prenatal Transfer Tool  Maternal Diabetes: No Genetic Screening: Declined Maternal Ultrasounds/Referrals: Normal Fetal Ultrasounds or other Referrals:   None Maternal Substance Abuse:  No Significant Maternal Medications:  None Significant Maternal Lab Results: Lab values include: Other: GBS unknown  No results found for this or any previous visit (from the past 24 hour(s)).  Patient Active Problem List   Diagnosis Date Noted  . Supervision of normal pregnancy 01/03/2015  . History of preterm delivery, currently pregnant in first trimester 12/21/2014  . Right knee pain 02/10/2014    Assessment: Kelsey Flores is a 35 y.o. G2P0101 at [redacted]w[redacted]d here for preterm labor  #Labor: expectant management, no augmentation at this point given preterm status #Pain: Prn medications/epidural #FWB: Cat I. Given BMZ in MAU given preterm status. #ID:  GBS unknown, start PCN given preterm status #MOF: breast #MOC:condoms #Circ:  Yes- planning at [facility] 07/04/2015, 10:55 AM

## 2015-07-04 NOTE — Telephone Encounter (Signed)
Pt states saw Janan Ridge, CNM yesterday for routine PNV was dilated to 6 cm and 80 % effaced, now having contraction q 7 minutes, no gush of fluids. Pt advised to go to [facility] now to be evaluated. Pt verbalized understanding.

## 2015-07-05 ENCOUNTER — Encounter (HOSPITAL_COMMUNITY): Payer: Self-pay | Admitting: *Deleted

## 2015-07-05 DIAGNOSIS — Z3A37 37 weeks gestation of pregnancy: Secondary | ICD-10-CM

## 2015-07-05 DIAGNOSIS — Z87891 Personal history of nicotine dependence: Secondary | ICD-10-CM

## 2015-07-05 LAB — STREP GP B NAA: STREP GROUP B AG: POSITIVE — AB

## 2015-07-05 LAB — RPR: RPR: NONREACTIVE

## 2015-07-05 MED ORDER — WITCH HAZEL-GLYCERIN EX PADS: 1.0000 "application " | MEDICATED_PAD | CUTANEOUS | Status: DC | PRN

## 2015-07-05 MED ORDER — TETANUS-DIPHTH-ACELL PERTUSSIS 5-2.5-18.5 LF-MCG/0.5 IM SUSP: 0.5000 mL | Freq: Once | INTRAMUSCULAR | Status: DC

## 2015-07-05 MED ORDER — SIMETHICONE 80 MG PO CHEW: 80.0000 mg | CHEWABLE_TABLET | ORAL | Status: DC | PRN

## 2015-07-05 MED ORDER — ZOLPIDEM TARTRATE 5 MG PO TABS: 5.0000 mg | ORAL_TABLET | Freq: Every evening | ORAL | Status: DC | PRN

## 2015-07-05 MED ORDER — ONDANSETRON HCL 4 MG PO TABS: 4.0000 mg | ORAL_TABLET | ORAL | Status: DC | PRN

## 2015-07-05 MED ORDER — ACETAMINOPHEN 325 MG PO TABS
650.0000 mg | ORAL_TABLET | ORAL | Status: DC | PRN
  Administered 2015-07-05: 650 mg via ORAL
  Filled 2015-07-05: qty 2

## 2015-07-05 MED ORDER — SENNOSIDES-DOCUSATE SODIUM 8.6-50 MG PO TABS
2.0000 | ORAL_TABLET | ORAL | Status: DC
  Administered 2015-07-05 – 2015-07-06 (×2): 2 via ORAL
  Filled 2015-07-05 (×2): qty 2

## 2015-07-05 MED ORDER — IBUPROFEN 600 MG PO TABS
600.0000 mg | ORAL_TABLET | Freq: Four times a day (QID) | ORAL | Status: DC
  Administered 2015-07-05 – 2015-07-07 (×9): 600 mg via ORAL
  Filled 2015-07-05 (×10): qty 1

## 2015-07-05 MED ORDER — LANOLIN HYDROUS EX OINT: TOPICAL_OINTMENT | CUTANEOUS | Status: DC | PRN

## 2015-07-05 MED ORDER — PRENATAL MULTIVITAMIN CH
1.0000 | ORAL_TABLET | Freq: Every day | ORAL | Status: DC
  Administered 2015-07-05 – 2015-07-06 (×2): 1 via ORAL
  Filled 2015-07-05 (×2): qty 1

## 2015-07-05 MED ORDER — DIPHENHYDRAMINE HCL 25 MG PO CAPS: 25.0000 mg | ORAL_CAPSULE | Freq: Four times a day (QID) | ORAL | Status: DC | PRN

## 2015-07-05 MED ORDER — DIBUCAINE 1 % RE OINT: 1.0000 "application " | TOPICAL_OINTMENT | RECTAL | Status: DC | PRN

## 2015-07-05 MED ORDER — ONDANSETRON HCL 4 MG/2ML IJ SOLN: 4.0000 mg | INTRAMUSCULAR | Status: DC | PRN

## 2015-07-05 MED ORDER — BENZOCAINE-MENTHOL 20-0.5 % EX AERO: 1.0000 "application " | INHALATION_SPRAY | CUTANEOUS | Status: DC | PRN

## 2015-07-05 NOTE — Progress Notes (Signed)
Post Partum Day 0  Subjective:  Crystalynn KRISANNE LICH is a 35 y.o. G2P0101 [redacted]w[redacted]d s/p NSVD.  No acute events overnight.  Pt Denies problems with ambulating, voiding or po intake.  She denies nausea or vomiting.  Pain is well controlled.  She has not had flatus. She has not had bowel movement.  Lochia small. Plan for birth control is condoms.  Method of Feeding: Breast  Objective: BP 118/60 mmHg  Pulse 70  Temp(Src) 98.5 F (36.9 C) (Oral)  Resp 20  Ht  (1.676 m)  Wt 83.462 kg (184 lb)  BMI 29.71 kg/m2  LMP 10/19/2014 (Exact Date)  Physical Exam:  General: alert, cooperative and no distress Lochia:normal flow Abdomen: +BS, soft, nontender, fundus firm at/below umbilicus Uterine Fundus: firm DVT Evaluation: No evidence of DVT seen on physical exam.    Recent Labs  07/04/15 1105  HGB 12.5  HCT 36.0    Assessment/Plan:  ASSESSMENT: Marylynn E Regas is a 35 y.o. G2P0101 [redacted]w[redacted]d ppd #0 s/p NSVDdoing well.   1.) PPD#0: Encourage ambulation. Monitor lochia. Pain controlled with scheduled Ibuprofen 2.) Breastfeeding 3.) Condoms Disposition: d/c tomorrow    LOS: 1 day   Esmond Camper 07/05/2015, 8:05 AM   I have seen and examined this patient and I agree with the above. Cam Hai CNM 9:37 AM 07/05/2015

## 2015-07-05 NOTE — Lactation Note (Signed)
This note was copied from the chart of Kelsey Flores. Lactation Consultation Note  Patient Name: Kelsey Flores Date: 07/05/2015 Reason for consult: Initial assessment   With this mom and 12 hours old early term baby, weighing 6 lbs 10.9 oz. He was being held by grandparents when I walked in the room. Mom had attempted feeding him earlier, but the baby had fed well only once so far. I assisted mom with latching the baby in football hold, and he latached deeply aaaaand well for 5 minutes, and then unlatached, asleep. I set up DEP, at mom's request, to pump if baby does not latch well, to protect her supply and provide EBm for the baby. The baby has voided once and passed stool twice, so Mom is aware the baby is doing well so far.Skin to skin encouraged, and basic teaching from the Baby and Me book reivewed, as well as lactation services. Mom will call for questions/concerns.    Maternal Data Formula Feeding for Exclusion: No Has patient been taught Hand Expression?: Yes Does the patient have breastfeeding experience prior to this delivery?: Yes  Feeding Feeding Type: Breast Fed Length of feed: 5 min  LATCH Score/Interventions Latch: Grasps breast easily, tongue down, lips flanged, rhythmical sucking. Intervention(s): Skin to skin;Teach feeding cues;Waking techniques  Audible Swallowing: None Intervention(s): Skin to skin;Hand expression (small drops pf clear colostrum)  Type of Nipple: Everted at rest and after stimulation (short shaft, semi flat) Intervention(s): Hand pump;Double electric pump  Comfort (Breast/Nipple): Soft / non-tender     Hold (Positioning): Assistance needed to correctly position infant at breast and maintain latch. (mom prefers football hold) Intervention(s): Breastfeeding basics reviewed;Support Pillows;Position options;Skin to skin  LATCH Score: 7  Lactation Tools Discussed/Used Pump Review: Setup, frequency, and cleaning;Milk Storage;Other  (comment) (hand expression and initiation setting shown to mom) Initiated by:: c Nedra Hai RN IBCLC Date initiated:: 07/05/15   Consult Status Consult Status: Follow-up Date: 07/06/15 Follow-up type: In-patient    Kelsey Flores 07/05/2015, 1:40 PM

## 2015-07-06 NOTE — Lactation Note (Addendum)
This note was copied from the chart of Kelsey Flores. Lactation Consultation Note  Patient Name: Kelsey Flores Date: 07/06/2015 Reason for consult: Follow-up assessment  Baby 36 hours old. Mom reports that baby nursing well, better on right breast than left. Mom nursing baby when this LC entered room in cradle position on left breast. Baby barely hanging onto the tip of the nipple. Assisted mom to latch baby in cross-cradle position and baby latched deeply, suckling rhythmically with intermittent swallows noted. Demonstrated to mom how to flange baby's lower lip, and mom reported increased comfort. Discussed with mom that if baby sleepy at breast, she needs to switch positions and latch baby again. Mom states that her breasts are starting to fill. Enc mom to call insurance to request her DEBP. Mom has pump in room available to use if baby not nursing and swallowing well. Enc mom to offer lots of STS and attempts at breast.   Mom states that she has been allowing baby to sit at the breast after baby stops nursing. Enc mom to move baby up on her chest and attempt to burp baby, and if baby cueing to continue nursing, to switch breast and let baby continue nursing. Mom states that baby has been much better and more active at the breast than her first baby. Enc mom to nurse with cues and call for assistance if baby not willing to nurse.  Maternal Data    Feeding Feeding Type: Breast Fed Length of feed:  (Re-latched and assessed 10 minutes of BF.)  LATCH Score/Interventions Latch: Grasps breast easily, tongue down, lips flanged, rhythmical sucking. Intervention(s): Skin to skin;Teach feeding cues;Waking techniques  Audible Swallowing: Spontaneous and intermittent Intervention(s): Skin to skin;Hand expression  Type of Nipple: Everted at rest and after stimulation  Comfort (Breast/Nipple): Filling, red/small blisters or bruises, mild/mod discomfort  Problem noted:  Filling;Mild/Moderate discomfort  Hold (Positioning): Assistance needed to correctly position infant at breast and maintain latch. Intervention(s): Breastfeeding basics reviewed;Support Pillows;Position options;Skin to skin  LATCH Score: 8  Lactation Tools Discussed/Used     Consult Status Consult Status: Follow-up Date: 07/07/15 Follow-up type: In-patient    Geralynn Ochs 07/06/2015, 12:48 PM

## 2015-07-06 NOTE — Progress Notes (Signed)
Post Partum Day 1 Subjective: no complaints, up ad lib, voiding and tolerating PO, small lochia, plans to breastfeed, condoms  Objective: Blood pressure 118/79, pulse 57, temperature 97.9 F (36.6 C), temperature source Oral, resp. rate 18, height  (1.676 m), weight 83.462 kg (184 lb), last menstrual period 10/19/2014, SpO2 100 %, unknown if currently breastfeeding.  Physical Exam:  General: alert, cooperative and no distress Lochia:normal flow Chest: CTAB Heart: RRR no m/r/g Abdomen: +BS, soft, nontender,  Uterine Fundus: firm DVT Evaluation: No evidence of DVT seen on physical exam. Extremities: no edema   Recent Labs  07/04/15 1105  HGB 12.5  HCT 36.0    Assessment/Plan: Plan for discharge tomorrow   LOS: 2 days   CRESENZO-DISHMAN,Stormi Vandevelde 07/06/2015, 7:47 AM

## 2015-07-07 MED ORDER — IBUPROFEN 600 MG PO TABS: 600.0000 mg | ORAL_TABLET | Freq: Four times a day (QID) | ORAL | Status: DC | PRN

## 2015-07-07 NOTE — Discharge Summary (Signed)
OB Discharge Summary     Patient Name: Kelsey Flores DOB: Jul 07, 1980 MRN: [id number]  Date of admission: 07/04/2015 Delivering MD: Palma Holter   Date of discharge: 07/07/2015  Admitting diagnosis: 36WKS, POSSIBLE LABOR Intrauterine pregnancy: [redacted]w[redacted]d     Secondary diagnosis:  Active Problems:   Active labor   SVD (spontaneous vaginal delivery)  Additional problems: none     Discharge diagnosis: Term Pregnancy Delivered                                                                                                Post partum procedures:none  Augmentation: AROM  Complications: None  Hospital course:  Onset of Labor With Vaginal Delivery     35 y.o. yo [id number] at [redacted]w[redacted]d was admitted in Latent Labor on 07/04/2015 @ 36.6wks dilated to 7/90. She was given a dose of BMZ and started on PCN for GBS ppx. Just before midnight her cx was still the same and so her membranes were ruptured and the pt proceed to deliver within about 40 mins, now at 37.0wks.  Patient had an uncomplicated labor course as follows:  Membrane Rupture Time/Date: 11:37 PM ,07/05/2015   Intrapartum Procedures: Episiotomy: None [1]                                         Lacerations:  None [1]  Patient had a delivery of a Viable infant. 07/05/2015  Information for the patient's newborn:  Alexx, Giambra Raiyah [[id number]]  Delivery Method: Vaginal, Spontaneous Delivery (Filed from Delivery Summary)    Pateint had an uncomplicated postpartum course.  She is ambulating, tolerating a regular diet, passing flatus, and urinating well. Patient is discharged home in stable condition on 07/07/2015.    Physical exam  Filed Vitals:   07/05/15 1735 07/06/15 0630 07/06/15 1827 07/07/15 0539  BP: 126/80 118/79 129/76 123/87  Pulse: 67 57 99 70  Temp: 98.1 F (36.7 C) 97.9 F (36.6 C) 98 F (36.7 C) 98.2 F (36.8 C)  TempSrc: Oral  Oral   Resp: Height:      Weight:      SpO2: 100%      General: alert  and cooperative Lochia: appropriate Uterine Fundus: firm Incision: N/A DVT Evaluation: No evidence of DVT seen on physical exam. Labs: Lab Results  Component Value Date   WBC 13.4* 07/04/2015   HGB 12.5 07/04/2015   HCT 36.0 07/04/2015   MCV 92.3 07/04/2015   PLT 293 07/04/2015   No flowsheet data found.  Discharge instruction: per After Visit Summary and "Baby and Me Booklet".  After visit meds:    Medication List    STOP taking these medications        calcium carbonate 500 MG chewable tablet  Commonly known as:  TUMS - dosed in mg elemental calcium      TAKE these medications        ibuprofen 600 MG tablet  Commonly known as:  ADVIL,MOTRIN  Take 1 tablet (600 mg total) by mouth every 6 (six) hours as needed.     PRENATAL VITAMIN PO  Take 1 tablet by mouth daily.        Diet: routine diet  Activity: Advance as tolerated. Pelvic rest for 6 weeks.   Outpatient follow up:6 weeks Follow up Appt:No future appointments. Follow up Visit:No Follow-up on file.  Postpartum contraception: Condoms  Newborn Data: Live born female  Birth Weight: 6 lb 10.9 oz (3030 g) APGAR: 9, 9  Baby Feeding: Breast Disposition:home with mother   07/07/2015 Cam Hai, CNM  8:58 AM

## 2015-07-07 NOTE — Lactation Note (Signed)
This note was copied from the chart of Kelsey Charnice Crammer. Lactation Consultation Note  Baby latched with #20NS.  Sucks and a few swallow observed with stimulation. Mother is engorged.  Provided ice packs.  Reviewed massage and doing some post pumping. Gave mother #20NS and #24NS. Suggest she post pump 4-6 x a day for 10-15 min and give back to baby. Attempted latching without NS at least once a day until soreness subsides. Reviewed applying ebm for soreness. Reviewed LPI feeding behavior. Mom encouraged to feed baby 8-12 times/24 hours and with feeding cues.    Patient Name: Kelsey Flores Date: 07/07/2015 Reason for consult: Follow-up assessment   Maternal Data    Feeding Feeding Type: Breast Fed Length of feed: 30 min  LATCH Score/Interventions Latch: Grasps breast easily, tongue down, lips flanged, rhythmical sucking.  Audible Swallowing: A few with stimulation  Type of Nipple: Everted at rest and after stimulation  Comfort (Breast/Nipple): Filling, red/small blisters or bruises, mild/mod discomfort  Problem noted: Mild/Moderate discomfort;Filling Interventions (Filling): Massage;Frequent nursing;Reverse pressure;Double electric pump Interventions (Mild/moderate discomfort): Hand expression;Reverse pressue;Hand massage;Post-pump  Hold (Positioning): No assistance needed to correctly position infant at breast.  LATCH Score: 8  Lactation Tools Discussed/Used Tools: Nipple Dorris Carnes;Pump Nipple shield size: 20   Consult Status Consult Status: Complete    Hardie Pulley 07/07/2015, 11:10 AM

## 2015-07-07 NOTE — Discharge Instructions (Signed)

## 2015-07-10 ENCOUNTER — Encounter: Payer: Commercial Managed Care - PPO | Admitting: Women's Health

## 2015-08-20 ENCOUNTER — Ambulatory Visit: Payer: Commercial Managed Care - PPO | Admitting: Women's Health

## 2015-09-04 ENCOUNTER — Ambulatory Visit: Payer: Commercial Managed Care - PPO | Admitting: Women's Health

## 2021-03-28 DIAGNOSIS — M25561 Pain in right knee: Secondary | ICD-10-CM | POA: Diagnosis not present

## 2021-03-28 DIAGNOSIS — M222X1 Patellofemoral disorders, right knee: Secondary | ICD-10-CM | POA: Diagnosis not present

## 2021-04-16 DIAGNOSIS — M25561 Pain in right knee: Secondary | ICD-10-CM | POA: Diagnosis not present

## 2021-04-23 DIAGNOSIS — M25561 Pain in right knee: Secondary | ICD-10-CM | POA: Diagnosis not present

## 2021-04-23 DIAGNOSIS — R262 Difficulty in walking, not elsewhere classified: Secondary | ICD-10-CM | POA: Diagnosis not present

## 2021-04-23 DIAGNOSIS — M222X1 Patellofemoral disorders, right knee: Secondary | ICD-10-CM | POA: Diagnosis not present

## 2021-04-23 DIAGNOSIS — R6889 Other general symptoms and signs: Secondary | ICD-10-CM | POA: Diagnosis not present

## 2021-05-09 DIAGNOSIS — M222X1 Patellofemoral disorders, right knee: Secondary | ICD-10-CM | POA: Diagnosis not present

## 2021-05-14 DIAGNOSIS — M222X1 Patellofemoral disorders, right knee: Secondary | ICD-10-CM | POA: Diagnosis not present

## 2021-05-23 DIAGNOSIS — M222X1 Patellofemoral disorders, right knee: Secondary | ICD-10-CM | POA: Diagnosis not present

## 2021-05-28 DIAGNOSIS — M222X1 Patellofemoral disorders, right knee: Secondary | ICD-10-CM | POA: Diagnosis not present

## 2021-06-04 DIAGNOSIS — M222X1 Patellofemoral disorders, right knee: Secondary | ICD-10-CM | POA: Diagnosis not present

## 2021-06-11 DIAGNOSIS — R278 Other lack of coordination: Secondary | ICD-10-CM | POA: Diagnosis not present

## 2021-06-11 DIAGNOSIS — M6289 Other specified disorders of muscle: Secondary | ICD-10-CM | POA: Diagnosis not present

## 2021-06-11 DIAGNOSIS — M222X1 Patellofemoral disorders, right knee: Secondary | ICD-10-CM | POA: Diagnosis not present

## 2021-06-11 DIAGNOSIS — M25561 Pain in right knee: Secondary | ICD-10-CM | POA: Diagnosis not present

## 2021-06-25 DIAGNOSIS — R2689 Other abnormalities of gait and mobility: Secondary | ICD-10-CM | POA: Diagnosis not present

## 2021-06-25 DIAGNOSIS — R29898 Other symptoms and signs involving the musculoskeletal system: Secondary | ICD-10-CM | POA: Diagnosis not present

## 2021-06-25 DIAGNOSIS — M222X1 Patellofemoral disorders, right knee: Secondary | ICD-10-CM | POA: Diagnosis not present

## 2021-07-09 DIAGNOSIS — M25561 Pain in right knee: Secondary | ICD-10-CM | POA: Diagnosis not present

## 2021-07-09 DIAGNOSIS — M6289 Other specified disorders of muscle: Secondary | ICD-10-CM | POA: Diagnosis not present

## 2021-07-09 DIAGNOSIS — M222X1 Patellofemoral disorders, right knee: Secondary | ICD-10-CM | POA: Diagnosis not present

## 2021-07-09 DIAGNOSIS — R278 Other lack of coordination: Secondary | ICD-10-CM | POA: Diagnosis not present

## 2021-08-13 DIAGNOSIS — M6289 Other specified disorders of muscle: Secondary | ICD-10-CM | POA: Diagnosis not present

## 2021-08-13 DIAGNOSIS — M25561 Pain in right knee: Secondary | ICD-10-CM | POA: Diagnosis not present

## 2021-08-13 DIAGNOSIS — R2689 Other abnormalities of gait and mobility: Secondary | ICD-10-CM | POA: Diagnosis not present

## 2021-08-13 DIAGNOSIS — M6281 Muscle weakness (generalized): Secondary | ICD-10-CM | POA: Diagnosis not present

## 2022-04-01 DIAGNOSIS — M25462 Effusion, left knee: Secondary | ICD-10-CM | POA: Diagnosis not present

## 2022-04-01 DIAGNOSIS — M25562 Pain in left knee: Secondary | ICD-10-CM | POA: Diagnosis not present

## 2022-04-01 DIAGNOSIS — M2242 Chondromalacia patellae, left knee: Secondary | ICD-10-CM | POA: Diagnosis not present

## 2022-05-13 ENCOUNTER — Encounter: Payer: Self-pay | Admitting: Obstetrics & Gynecology

## 2022-05-13 ENCOUNTER — Other Ambulatory Visit (HOSPITAL_COMMUNITY)
Admission: RE | Admit: 2022-05-13 | Discharge: 2022-05-13 | Disposition: A | Discharge status: Home / Self Care | Payer: BC Managed Care – PPO | Source: Ambulatory Visit | Attending: Obstetrics & Gynecology | Admitting: Obstetrics & Gynecology

## 2022-05-13 ENCOUNTER — Ambulatory Visit (INDEPENDENT_AMBULATORY_CARE_PROVIDER_SITE_OTHER): Payer: BC Managed Care – PPO | Admitting: Obstetrics & Gynecology

## 2022-05-13 VITALS — BP 130/84 | HR 86 | Ht 66.0 in | Wt 191.6 lb

## 2022-05-13 DIAGNOSIS — Z01419 Encounter for gynecological examination (general) (routine) without abnormal findings: Secondary | ICD-10-CM | POA: Diagnosis not present

## 2022-05-13 NOTE — Progress Notes (Signed)
WELL-WOMAN EXAMINATION Patient name: Kelsey Flores MRN [id number]  Date of birth: 08/18/1980 Chief Complaint:   Gynecologic Exam  History of Present Illness:   Kelsey Flores is a 41 y.o. [phone] female being seen today for a routine well-woman exam.  Today she notes some change in her menses.  Menses are "monthly-ish" not always at the same time.  She will bleed for a few days then stop then come back for another 5 days.  Sometimes bleeding is heavy and requires both a pad and tampon.  Denies fatigue, dizziness, occasional headache.  Of note, while they are not actively pursuing a pregnancy, they would be excited if it happened.  Previously using condoms, "trying" for the past 4 mos.  Would not pursue ART.  Previously used Depot and OCPs.  Denies issues becoming pregnant in the past.  Denies irregular discharge, itching or irritation.  Denies pelvic or abdominal pain.  No other acute complaints  Patient's last menstrual period was 05/12/2022 (exact date).  The current method of family planning is none.    Last pap due today.  Last mammogram: n/a. Last colonoscopy: n/a     05/13/2022   10:15 AM  Depression screen PHQ 2/9  Decreased Interest 0  Down, Depressed, Hopeless 0  PHQ - 2 Score 0  Altered sleeping 1  Tired, decreased energy 1  Change in appetite 1  Feeling bad or failure about yourself  0  Trouble concentrating 0  Moving slowly or fidgety/restless 0  Suicidal thoughts 0  PHQ-9 Score 3      Review of Systems:   Pertinent items are noted in HPI Denies any headaches, blurred vision, fatigue, shortness of breath, chest pain, abdominal pain, bowel movements, urination, or intercourse unless otherwise stated above.  Pertinent History Reviewed:  Reviewed past medical,surgical, social and family history.  Reviewed problem list, medications and allergies. Physical Assessment:   Vitals:   05/13/22 1009  BP: 130/84  Pulse: 86  Weight: 191 lb 9.6 oz (86.9 kg)   Height: 5\' 6"  (1.676 m)  Body mass index is 30.93 kg/m.        Physical Examination:   General appearance - well appearing, and in no distress  Mental status - alert, oriented to person, place, and time  Psych:  She has a normal mood and affect  Skin - warm and dry, normal color, no suspicious lesions noted  Chest - effort normal, all lung fields clear to auscultation bilaterally  Heart - normal rate and regular rhythm  Neck:  midline trachea, no thyromegaly or nodules  Breasts - breasts appear normal, no suspicious masses, no skin or nipple changes or  axillary nodes  Abdomen - soft, nontender, nondistended, no masses or organomegaly  Pelvic - VULVA: normal appearing vulva with no masses, tenderness or lesions  VAGINA: normal appearing vagina with normal color and discharge, no lesions  CERVIX: normal appearing cervix without discharge or lesions, no CMT  Thin prep pap is done with HR HPV cotesting  UTERUS: uterus is felt to be normal size, shape, consistency and nontender   ADNEXA: No adnexal masses or tenderness noted.  Extremities:  No swelling or varicosities noted  Chaperone:  pt declined      Assessment & Plan:  1) Well-Woman Exam -Pap collected, reviewed ASCCP guidelines  2) Family planning, HMB -discussed infertility work up if desires in a few mos -discussed TXA if desires to help improve her period- declined at this time -discussed options to regulate  her menses, which would prevent pregnancy []  plan to obtain records from PCP- blood work completed- plan to check CBC -questions/concerns were addressed, f/u prn   Follow-up: Return in about 1 year (around 05/14/2023) for Annual, []  sign release for PCP records.   14/10/2022, DO Attending Obstetrician & Gynecologist, [facility], [facility]

## 2022-05-15 LAB — CYTOLOGY - PAP
Comment: NEGATIVE
Diagnosis: NEGATIVE
High risk HPV: NEGATIVE

## 2022-05-16 DIAGNOSIS — M25562 Pain in left knee: Secondary | ICD-10-CM | POA: Diagnosis not present

## 2022-05-16 DIAGNOSIS — R768 Other specified abnormal immunological findings in serum: Secondary | ICD-10-CM | POA: Diagnosis not present

## 2022-05-16 DIAGNOSIS — I7300 Raynaud's syndrome without gangrene: Secondary | ICD-10-CM | POA: Diagnosis not present

## 2022-05-16 DIAGNOSIS — M2540 Effusion, unspecified joint: Secondary | ICD-10-CM | POA: Diagnosis not present

## 2022-05-19 DIAGNOSIS — R768 Other specified abnormal immunological findings in serum: Secondary | ICD-10-CM | POA: Diagnosis not present

## 2022-05-19 DIAGNOSIS — M2540 Effusion, unspecified joint: Secondary | ICD-10-CM | POA: Diagnosis not present

## 2022-05-19 DIAGNOSIS — D8989 Other specified disorders involving the immune mechanism, not elsewhere classified: Secondary | ICD-10-CM | POA: Diagnosis not present

## 2022-05-19 DIAGNOSIS — L52 Erythema nodosum: Secondary | ICD-10-CM | POA: Diagnosis not present

## 2022-05-19 DIAGNOSIS — I7300 Raynaud's syndrome without gangrene: Secondary | ICD-10-CM | POA: Diagnosis not present

## 2022-05-19 DIAGNOSIS — R5383 Other fatigue: Secondary | ICD-10-CM | POA: Diagnosis not present

## 2022-06-26 DIAGNOSIS — M0579 Rheumatoid arthritis with rheumatoid factor of multiple sites without organ or systems involvement: Secondary | ICD-10-CM | POA: Diagnosis not present

## 2022-06-26 DIAGNOSIS — I7300 Raynaud's syndrome without gangrene: Secondary | ICD-10-CM | POA: Diagnosis not present

## 2022-06-26 DIAGNOSIS — M349 Systemic sclerosis, unspecified: Secondary | ICD-10-CM | POA: Diagnosis not present

## 2022-06-26 DIAGNOSIS — M2540 Effusion, unspecified joint: Secondary | ICD-10-CM | POA: Diagnosis not present

## 2022-07-09 ENCOUNTER — Other Ambulatory Visit (HOSPITAL_COMMUNITY): Payer: Self-pay

## 2022-07-09 DIAGNOSIS — R079 Chest pain, unspecified: Secondary | ICD-10-CM

## 2022-09-25 DIAGNOSIS — M2540 Effusion, unspecified joint: Secondary | ICD-10-CM | POA: Diagnosis not present

## 2022-09-25 DIAGNOSIS — M349 Systemic sclerosis, unspecified: Secondary | ICD-10-CM | POA: Diagnosis not present

## 2022-09-25 DIAGNOSIS — I7300 Raynaud's syndrome without gangrene: Secondary | ICD-10-CM | POA: Diagnosis not present

## 2022-09-25 DIAGNOSIS — M0579 Rheumatoid arthritis with rheumatoid factor of multiple sites without organ or systems involvement: Secondary | ICD-10-CM | POA: Diagnosis not present

## 2022-09-26 ENCOUNTER — Other Ambulatory Visit: Payer: Self-pay | Admitting: Rheumatology

## 2022-09-26 DIAGNOSIS — M349 Systemic sclerosis, unspecified: Secondary | ICD-10-CM

## 2022-10-08 ENCOUNTER — Ambulatory Visit (HOSPITAL_COMMUNITY)
Admission: RE | Admit: 2022-10-08 | Discharge: 2022-10-08 | Disposition: A | Discharge status: Home / Self Care | Payer: BC Managed Care – PPO | Source: Ambulatory Visit | Attending: Cardiology | Admitting: Cardiology

## 2022-10-08 ENCOUNTER — Encounter (HOSPITAL_COMMUNITY): Payer: Self-pay | Admitting: Cardiology

## 2022-10-08 ENCOUNTER — Ambulatory Visit (HOSPITAL_COMMUNITY)
Admission: RE | Admit: 2022-10-08 | Discharge: 2022-10-08 | Disposition: A | Discharge status: Home / Self Care | Payer: BC Managed Care – PPO | Source: Ambulatory Visit | Attending: Family Medicine | Admitting: Family Medicine

## 2022-10-08 ENCOUNTER — Ambulatory Visit (HOSPITAL_BASED_OUTPATIENT_CLINIC_OR_DEPARTMENT_OTHER)
Admission: RE | Admit: 2022-10-08 | Discharge: 2022-10-08 | Disposition: A | Discharge status: Home / Self Care | Payer: BC Managed Care – PPO | Source: Ambulatory Visit | Attending: Cardiology | Admitting: Cardiology

## 2022-10-08 VITALS — BP 104/60 | HR 82 | Wt 193.6 lb

## 2022-10-08 DIAGNOSIS — I7300 Raynaud's syndrome without gangrene: Secondary | ICD-10-CM | POA: Diagnosis not present

## 2022-10-08 DIAGNOSIS — R079 Chest pain, unspecified: Secondary | ICD-10-CM | POA: Diagnosis not present

## 2022-10-08 DIAGNOSIS — I272 Pulmonary hypertension, unspecified: Secondary | ICD-10-CM | POA: Diagnosis not present

## 2022-10-08 DIAGNOSIS — R7989 Other specified abnormal findings of blood chemistry: Secondary | ICD-10-CM | POA: Diagnosis not present

## 2022-10-08 DIAGNOSIS — K219 Gastro-esophageal reflux disease without esophagitis: Secondary | ICD-10-CM | POA: Diagnosis not present

## 2022-10-08 DIAGNOSIS — M1990 Unspecified osteoarthritis, unspecified site: Secondary | ICD-10-CM | POA: Diagnosis not present

## 2022-10-08 LAB — PULMONARY FUNCTION TEST
DL/VA % pred: 96 %
DL/VA: 4.22 ml/min/mmHg/L
DLCO unc % pred: 100 %
DLCO unc: 23.59 ml/min/mmHg
FEF 25-75 Post: 2.77 L/sec
FEF 25-75 Pre: 2.33 L/sec
FEF2575-%Change-Post: 18 %
FEF2575-%Pred-Post: 84 %
FEF2575-%Pred-Pre: 70 %
FEV1-%Change-Post: 3 %
FEV1-%Pred-Post: 99 %
FEV1-%Pred-Pre: 96 %
FEV1-Post: 3.22 L
FEV1-Pre: 3.12 L
FEV1FVC-%Change-Post: 3 %
FEV1FVC-%Pred-Pre: 88 %
FEV6-%Change-Post: 1 %
FEV6-%Pred-Post: 109 %
FEV6-%Pred-Pre: 107 %
FEV6-Post: 4.25 L
FEV6-Pre: 4.2 L
FEV6FVC-%Change-Post: 1 %
FEV6FVC-%Pred-Post: 101 %
FEV6FVC-%Pred-Pre: 100 %
FVC-%Change-Post: 0 %
FVC-%Pred-Post: 107 %
FVC-%Pred-Pre: 107 %
FVC-Post: 4.25 L
FVC-Pre: 4.25 L
Post FEV1/FVC ratio: 76 %
Post FEV6/FVC ratio: 100 %
Pre FEV1/FVC ratio: 73 %
Pre FEV6/FVC Ratio: 99 %
RV % pred: 106 %
RV: 1.77 L
TLC % pred: 115 %
TLC: 6.21 L

## 2022-10-08 LAB — ECHOCARDIOGRAM COMPLETE
Area-P 1/2: 3.37 cm2
Calc EF: 60.1 %
S' Lateral: 3.3 cm
Single Plane A2C EF: 59.6 %
Single Plane A4C EF: 58 %

## 2022-10-08 LAB — BRAIN NATRIURETIC PEPTIDE: B Natriuretic Peptide: 102.9 pg/mL — ABNORMAL HIGH (ref 0.0–100.0)

## 2022-10-08 MED ORDER — ALBUTEROL SULFATE (2.5 MG/3ML) 0.083% IN NEBU
2.5000 mg | INHALATION_SOLUTION | Freq: Once | RESPIRATORY_TRACT | Status: AC
  Administered 2022-10-08: 2.5 mg via RESPIRATORY_TRACT

## 2022-10-08 NOTE — Progress Notes (Signed)
Echocardiogram 2D Echocardiogram has been performed.  Kelsey Flores 10/08/2022, 11:58 AM

## 2022-10-08 NOTE — Patient Instructions (Signed)
Good to see you today!  Labs done today, your results will be available in MyChart, we will contact you for abnormal readings.   Your physician recommends that you schedule a follow-up appointment in:  as needed  If you have any questions or concerns before your next appointment please send [place] a message through [place] or call our office at [phone].    TO LEAVE A MESSAGE FOR THE NURSE SELECT OPTION 2, PLEASE LEAVE A MESSAGE INCLUDING: YOUR NAME DATE OF BIRTH CALL BACK NUMBER REASON FOR CALL**this is important as we prioritize the call backs  YOU WILL RECEIVE A CALL BACK THE SAME DAY AS LONG AS YOU CALL BEFORE 4:00 PM  At the [facility], you and your health needs are our priority. As part of our continuing mission to provide you with exceptional heart care, we have created designated Provider Care Teams. These Care Teams include your primary Cardiologist (physician) and Advanced Practice Providers (APPs- Physician Assistants and Nurse Practitioners) who all work together to provide you with the care you need, when you need it.   You may see any of the following providers on your designated Care Team at your next follow up: Dr Arvilla Meres Dr Marca Ancona Dr. Marcos Eke, NP Robbie Lis, [place] [facility] [place], [place] Brynda Peon, NP Karle Plumber, PharmD   Please be sure to bring in all your medications bottles to every appointment.    Thank you for choosing [facility]

## 2022-10-09 NOTE — Progress Notes (Signed)
PCP: Barbie Banner, MD Rheumatology: Dr. Drenda Freeze  42 y.o. 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 She also began to note Raynaud's phenomenon and worsening GERD.  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  Father had what sounds like a spontaneous PE.  She has no history of venous thromboembolism.  She has had no significant exertional dyspnea or chest pain.  She feels like her exercise tolerance is normal/unchanged.  She does not get unduly fatigued.  No lightheadedness or palpitations.  No orthopnea/PND.   Echo was done today and reviewed, EF 60-65% with GLS normal -25.5%, normal diastolic function, normal RV, unable to estimate PA systolic pressure, IVC normal.  PFTs in 4/24 showed minimal obstruction possibly from small airways disease, DLCO was normal.   ECG (personally reviewed): NSR, normal  PMH: 1. 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  - Echo (4/24): EF 60-65% with GLS normal -25.5%, normal diastolic function, normal RV, unable to estimate PA systolic pressure, IVC normal.  - PFTs (4/24): minimal obstruction possibly from small airways disease, DLCO was normal.  2. Raynaud's syndrome 3. GERD  SH: Married, has 2 kids she home-schools, no ETOH or smoking. Lives in [place].   FH: Father with PE around 63 (spontaneous)  ROS: All systems reviewed and negative except as per HPI.   Current Outpatient Medications  Medication Sig Dispense Refill   hydroxychloroquine (PLAQUENIL) 200 MG tablet Take 400 mg by mouth daily.     No current facility-administered medications for this encounter.   BP 104/60   Pulse 82   Wt 87.8 kg (193 lb 9.6 oz)   SpO2 100%   BMI  31.25 kg/m  General: NAD Neck: No JVD, no thyromegaly or thyroid nodule.  Lungs: Clear to auscultation bilaterally with normal respiratory effort. CV: Nondisplaced PMI.  Heart regular S1/S2, no S3/S4, no murmur.  No peripheral edema.  No carotid bruit.  Normal pedal pulses.  Abdomen: Soft, nontender, no hepatosplenomegaly, no distention.  Skin: Intact without lesions or rashes.  Neurologic: Alert and oriented x 3.  Psych: Normal affect. Extremities: No clubbing or cyanosis.  HEENT: Normal.   Assessment/Plan: 1. Cardiac screening in setting of scleroderma/overlap syndrome:There is significant risk for pulmonary hypertension with scleroderma.  Patient does not have worrisome exertional symptoms and ECG is normal. Patient does not have evidence by echo for pulmonary vascular disease, RV is normal though PA systolic pressure could not be estimated.  She had PFTs that show normal DLCO (generally decreased with pulmonary vascular disease). She does have a very slightly elevated BNP level.  - I would recommend repeat echo and PFTs with DLCO in 1 year given slightly elevated BNP.  Do not think this warrants RHC.  2. 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  Followed by rheumatology.   Marca Ancona 10/09/2022 8:21 AM

## 2022-10-28 ENCOUNTER — Encounter: Payer: Self-pay | Admitting: Rheumatology

## 2022-10-30 ENCOUNTER — Ambulatory Visit
Admission: RE | Admit: 2022-10-30 | Discharge: 2022-10-30 | Disposition: A | Discharge status: Home / Self Care | Payer: BC Managed Care – PPO | Source: Ambulatory Visit | Attending: Rheumatology | Admitting: Rheumatology

## 2022-10-30 DIAGNOSIS — E041 Nontoxic single thyroid nodule: Secondary | ICD-10-CM | POA: Diagnosis not present

## 2022-10-30 DIAGNOSIS — M349 Systemic sclerosis, unspecified: Secondary | ICD-10-CM | POA: Diagnosis not present

## 2023-01-06 DIAGNOSIS — M349 Systemic sclerosis, unspecified: Secondary | ICD-10-CM | POA: Diagnosis not present

## 2023-01-06 DIAGNOSIS — I7300 Raynaud's syndrome without gangrene: Secondary | ICD-10-CM | POA: Diagnosis not present

## 2023-01-06 DIAGNOSIS — M2540 Effusion, unspecified joint: Secondary | ICD-10-CM | POA: Diagnosis not present

## 2023-01-06 DIAGNOSIS — M0579 Rheumatoid arthritis with rheumatoid factor of multiple sites without organ or systems involvement: Secondary | ICD-10-CM | POA: Diagnosis not present

## 2024-02-12 DIAGNOSIS — S90812A Abrasion, left foot, initial encounter: Secondary | ICD-10-CM | POA: Diagnosis not present

## 2024-04-04 DIAGNOSIS — M62838 Other muscle spasm: Secondary | ICD-10-CM | POA: Diagnosis not present

## 2024-04-04 DIAGNOSIS — Z3200 Encounter for pregnancy test, result unknown: Secondary | ICD-10-CM | POA: Diagnosis not present
